# Patient Record
Sex: Female | Born: 2000 | Race: White | Hispanic: No | Marital: Single | Smoking: Never smoker
Health system: Southern US, Community
[De-identification: ages and names within clinical notes are randomized; demographics above are authoritative.]

## PROBLEM LIST (undated history)

## (undated) DIAGNOSIS — F3181 Bipolar II disorder: Secondary | ICD-10-CM

## (undated) DIAGNOSIS — F419 Anxiety disorder, unspecified: Secondary | ICD-10-CM

## (undated) DIAGNOSIS — F329 Major depressive disorder, single episode, unspecified: Secondary | ICD-10-CM

## (undated) DIAGNOSIS — E282 Polycystic ovarian syndrome: Secondary | ICD-10-CM

## (undated) DIAGNOSIS — F32A Depression, unspecified: Secondary | ICD-10-CM

## (undated) DIAGNOSIS — F909 Attention-deficit hyperactivity disorder, unspecified type: Secondary | ICD-10-CM

## (undated) HISTORY — DX: Bipolar II disorder: F31.81

## (undated) HISTORY — DX: Anxiety disorder, unspecified: F41.9

## (undated) HISTORY — DX: Depression, unspecified: F32.A

## (undated) HISTORY — DX: Polycystic ovarian syndrome: E28.2

## (undated) HISTORY — DX: Major depressive disorder, single episode, unspecified: F32.9

## (undated) HISTORY — DX: Attention-deficit hyperactivity disorder, unspecified type: F90.9

## (undated) HISTORY — PX: WISDOM TOOTH EXTRACTION: SHX21

---
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
## 2019-02-27 ENCOUNTER — Ambulatory Visit: Payer: 59 | Admitting: Adult Health

## 2019-04-30 DIAGNOSIS — R05 Cough: Secondary | ICD-10-CM | POA: Diagnosis not present

## 2019-04-30 DIAGNOSIS — J4521 Mild intermittent asthma with (acute) exacerbation: Secondary | ICD-10-CM | POA: Diagnosis not present

## 2019-05-02 ENCOUNTER — Ambulatory Visit: Payer: MEDICAID | Admitting: Family Medicine

## 2019-05-08 ENCOUNTER — Ambulatory Visit: Payer: MEDICAID | Admitting: Family Medicine

## 2019-05-11 DIAGNOSIS — U071 COVID-19: Secondary | ICD-10-CM | POA: Diagnosis not present

## 2019-05-14 ENCOUNTER — Other Ambulatory Visit: Payer: Self-pay

## 2019-05-15 ENCOUNTER — Ambulatory Visit (INDEPENDENT_AMBULATORY_CARE_PROVIDER_SITE_OTHER): Payer: 59 | Admitting: Family Medicine

## 2019-05-15 ENCOUNTER — Other Ambulatory Visit: Payer: Self-pay

## 2019-05-15 ENCOUNTER — Encounter: Payer: Self-pay | Admitting: Family Medicine

## 2019-05-15 VITALS — BP 123/79 | HR 90 | Temp 98.9°F | Ht 64.0 in | Wt 282.0 lb

## 2019-05-15 DIAGNOSIS — F322 Major depressive disorder, single episode, severe without psychotic features: Secondary | ICD-10-CM

## 2019-05-15 DIAGNOSIS — F319 Bipolar disorder, unspecified: Secondary | ICD-10-CM | POA: Diagnosis not present

## 2019-05-15 DIAGNOSIS — R42 Dizziness and giddiness: Secondary | ICD-10-CM | POA: Diagnosis not present

## 2019-05-15 DIAGNOSIS — K921 Melena: Secondary | ICD-10-CM | POA: Diagnosis not present

## 2019-05-15 DIAGNOSIS — Z23 Encounter for immunization: Secondary | ICD-10-CM | POA: Diagnosis not present

## 2019-05-15 DIAGNOSIS — J4520 Mild intermittent asthma, uncomplicated: Secondary | ICD-10-CM

## 2019-05-15 MED ORDER — FLUTICASONE PROPIONATE 50 MCG/ACT NA SUSP: 2.0000 | Freq: Every day | NASAL | 0 refills | Status: DC

## 2019-05-15 NOTE — Patient Instructions (Signed)
Flonase in the morning. Antihistamine (claritin, zyrtec, allegra, xyzal) in the evening.

## 2019-05-15 NOTE — Progress Notes (Signed)
New Patient Office Visit  Assessment & Plan:  1. Dizziness - Encouraged patient to use Flonase every morning and an antihistamine every evening to improve dizziness. - fluticasone (FLONASE) 50 MCG/ACT nasal spray; Place 2 sprays into both nostrils daily.  Dispense: 16 g; Refill: 0  2. Need for immunization against influenza - Flu Vaccine QUAD 36+ mos IM  3. Frank blood in stool - Advised patient to monitor for more blood in her stool as I feel this is likely due to the fact that she was having so many bowel movements in one day that started out as constipation.  4. Mild intermittent asthma without complication - Well controlled on current regimen.   5-6. Bipolar depression (HCC)/Severe major depression (HCC) - Managed by psychiatrist.   Follow-up: Return in about 6 months (around 11/15/2019) for annual physical.   Deliah Boston, MSN, APRN, FNP-C [facility]  Subjective:  Patient ID: Frances Clark, female    DOB: [birth year]  Age: 19 y.o. MRN: [id number]  Patient Care Team: [name], FNP as PCP - General (Family Medicine) Georgiann Cocker, MD as Consulting Physician (Psychiatry)  CC:  Chief Complaint  Patient presents with  . New Patient (Initial Visit)    [employer]   . Establish Care  . Dizziness    COVID + 2/15 and has been dizzy since then  . Blood In Stools    last night once     HPI Frances Clark presents to establish care. She is transferring care from Dr. Lomax Poehler [place] office as he has retired and the office has closed.  Prior to going to Dr. Lysbeth Galas patient was seeing Dr. Loney Hering.  Asthma: doesn't use Albuterol "too often". Hasn't had to use since she moved out of her mother's house a year ago - her mother smoked around her.   Patient has been experiencing dizziness since she had COVID-19 last month (2+ weeks). It lasts all day. It does have times when it is better or worse but does not completely resolve. Meclizine does not  improve it.   Patient reports she had six bowels movements yesterday with the last one having some "blood clots". The last stool was soft and unformed. The first BM was hard. The ones in-between were all "normal and not difficult". Normal bowel movements occur once daily. She normally has 1-2 per day.  States her last bowel movement was the day prior.  She feels "weird in her stomach" with slight nausea. States she doesn't feel it unless she thinks about it.  She did bring a picture of her last bowel movement with her.  There is frank blood in the toilet.  Patient also reports she has been told recently that she grinds her teeth in her sleep.  Depression screen PHQ 2/9 05/17/2019  Decreased Interest 3  Down, Depressed, Hopeless 2  PHQ - 2 Score 5  Altered sleeping 1  Tired, decreased energy 1  Change in appetite 3  Feeling bad or failure about yourself  2  Trouble concentrating 2  Moving slowly or fidgety/restless 1  Suicidal thoughts 1  PHQ-9 Score 16  Difficult doing work/chores Somewhat difficult  *Patient states she has only had thoughts that she would be better off dead but has no plans to hurt herself.  GAD 7 : Generalized Anxiety Score 05/17/2019  Nervous, Anxious, on Edge 2  Control/stop worrying 1  Worry too much - different things 1  Trouble relaxing 2  Restless 1  Easily annoyed or irritable 2  Afraid - awful might happen 1  Total GAD 7 Score 10  Anxiety Difficulty Somewhat difficult     Review of Systems  Constitutional: Negative for chills, fever, malaise/fatigue and weight loss.  HENT: Negative for congestion, ear discharge, ear pain, nosebleeds, sinus pain, sore throat and tinnitus.   Eyes: Negative for blurred vision, double vision, pain, discharge and redness.  Respiratory: Negative for cough, shortness of breath and wheezing.   Cardiovascular: Negative for chest pain, palpitations and leg swelling.  Gastrointestinal: Positive for blood in stool. Negative for  abdominal pain, constipation, diarrhea, heartburn, nausea and vomiting.  Genitourinary: Negative for dysuria, frequency and urgency.  Musculoskeletal: Negative for myalgias.  Skin: Negative for rash.  Neurological: Positive for dizziness. Negative for seizures, weakness and headaches.  Psychiatric/Behavioral: Negative for depression, substance abuse and suicidal ideas. The patient is not nervous/anxious.     Current Outpatient Medications:  .  acetaminophen (TYLENOL) 500 MG tablet, Take 1,000 mg by mouth every 6 (six) hours as needed for mild pain., Disp: , Rfl:  .  albuterol (PROVENTIL) (5 MG/ML) 0.5% nebulizer solution, Inhale 0.5 mLs into the lungs every 4 (four) hours as needed for wheezing., Disp: , Rfl:  .  albuterol (PROVENTIL,VENTOLIN) 90 MCG/ACT inhaler, Inhale 2-4 puffs into the lungs 2 (two) times daily as needed for wheezing or shortness of breath. Wheezing , Disp: , Rfl:  .  amphetamine-dextroamphetamine (ADDERALL XR) 5 MG 24 hr capsule, Take 1 capsule by mouth daily., Disp: , Rfl:  .  DULoxetine (CYMBALTA) 60 MG capsule, Take 1 capsule (60 mg total) by mouth daily. (Patient taking differently: Take 120 mg by mouth daily. ), Disp: 90 capsule, Rfl: 2 .  gabapentin (NEURONTIN) 300 MG capsule, Take 1 capsule by mouth daily as needed., Disp: , Rfl:  .  ibuprofen (ADVIL,MOTRIN) 200 MG tablet, Take 400 mg by mouth daily as needed for headache., Disp: , Rfl:  .  lamoTRIgine (LAMICTAL) 25 MG tablet, Take 1 tablet by mouth daily., Disp: , Rfl:  .  meclizine (ANTIVERT) 25 MG tablet, Take 25 mg by mouth every 8 (eight) hours as needed for dizziness., Disp: , Rfl:  .  fluticasone (FLONASE) 50 MCG/ACT nasal spray, Place 2 sprays into both nostrils daily., Disp: 16 g, Rfl: 0  Allergies  Allergen Reactions  . Other     CATS    Past Medical History:  Diagnosis Date  . Anxiety   . Asthma   . Bipolar 2 disorder (HCC)   . Depression   . Polycystic ovarian syndrome     Past Surgical  History:  Procedure Laterality Date  . CHOLECYSTECTOMY N/A 10/20/2018   Procedure: LAPAROSCOPIC CHOLECYSTECTOMY;  Surgeon: Axel Filler, MD;  Location: [facility] OR;  Service: General;  Laterality: N/A;  . ENDOSCOPIC RETROGRADE CHOLANGIOPANCREATOGRAPHY (ERCP) WITH PROPOFOL N/A 10/21/2018   Procedure: ENDOSCOPIC RETROGRADE CHOLANGIOPANCREATOGRAPHY (ERCP) WITH PROPOFOL;  Surgeon: Vida Rigger, MD;  Location: [facility] ENDOSCOPY;  Service: Endoscopy;  Laterality: N/A;  . REMOVAL OF STONES  10/21/2018   Procedure: REMOVAL OF STONES;  Surgeon: Vida Rigger, MD;  Location: [facility] ENDOSCOPY;  Service: Endoscopy;;  . Dennison Mascot  10/21/2018   Procedure: Dennison Mascot;  Surgeon: Vida Rigger, MD;  Location: [facility] ENDOSCOPY;  Service: Endoscopy;;  . WISDOM TOOTH EXTRACTION      Family History  Problem Relation Age of Onset  . Anxiety disorder Mother   . Drug abuse Mother   . Anxiety disorder Maternal Aunt   . Anxiety disorder Maternal  Grandmother   . Alcohol abuse Maternal Grandmother   . Alcohol abuse Paternal Grandmother   . Depression Paternal Grandmother   . Anxiety disorder Paternal Grandmother   . Drug abuse Maternal Aunt   . Asthma Sister   . Diabetes Maternal Grandfather   . Heart disease Paternal Grandfather     Social History   Socioeconomic History  . Marital status: Single    Spouse name: Not on file  . Number of children: Not on file  . Years of education: Not on file  . Highest education level: Not on file  Occupational History  . Not on file  Tobacco Use  . Smoking status: Never Smoker  . Smokeless tobacco: Never Used  Substance and Sexual Activity  . Alcohol use: No  . Drug use: No  . Sexual activity: Never  Other Topics Concern  . Not on file  Social History Narrative  . Not on file   Social Determinants of Health   Financial Resource Strain:   . Difficulty of Paying Living Expenses: Not on file  Food Insecurity:   . Worried About Charity fundraiser in the Last Year: Not on  file  . Ran Out of Food in the Last Year: Not on file  Transportation Needs:   . Lack of Transportation (Medical): Not on file  . Lack of Transportation (Non-Medical): Not on file  Physical Activity:   . Days of Exercise per Week: Not on file  . Minutes of Exercise per Session: Not on file  Stress:   . Feeling of Stress : Not on file  Social Connections:   . Frequency of Communication with Friends and Family: Not on file  . Frequency of Social Gatherings with Friends and Family: Not on file  . Attends Religious Services: Not on file  . Active Member of Clubs or Organizations: Not on file  . Attends Archivist Meetings: Not on file  . Marital Status: Not on file  Intimate Partner Violence:   . Fear of Current or Ex-Partner: Not on file  . Emotionally Abused: Not on file  . Physically Abused: Not on file  . Sexually Abused: Not on file    Objective:   Today's Vitals: BP 123/79   Pulse 90   Temp 98.9 F (37.2 C) (Temporal)   Ht 5\' 4"  (1.626 m)   Wt 282 lb (127.9 kg)   LMP 05/15/2018   SpO2 96%   BMI 48.41 kg/m   Physical Exam Vitals reviewed.  Constitutional:      General: She is not in acute distress.    Appearance: Normal appearance. She is morbidly obese. She is not ill-appearing, toxic-appearing or diaphoretic.  HENT:     Head: Normocephalic and atraumatic.     Right Ear: Ear canal and external ear normal. A middle ear effusion is present. There is no impacted cerumen.     Left Ear: Tympanic membrane, ear canal and external ear normal. There is no impacted cerumen.  Eyes:     General: No scleral icterus.       Right eye: No discharge.        Left eye: No discharge.     Conjunctiva/sclera: Conjunctivae normal.  Cardiovascular:     Rate and Rhythm: Normal rate and regular rhythm.     Heart sounds: Normal heart sounds. No murmur. No friction rub. No gallop.   Pulmonary:     Effort: Pulmonary effort is normal. No respiratory distress.  Breath sounds:  Normal breath sounds. No stridor. No wheezing, rhonchi or rales.  Musculoskeletal:        General: Normal range of motion.     Cervical back: Normal range of motion.  Skin:    General: Skin is warm and dry.     Capillary Refill: Capillary refill takes less than 2 seconds.  Neurological:     General: No focal deficit present.     Mental Status: She is alert and oriented to person, place, and time. Mental status is at baseline.  Psychiatric:        Mood and Affect: Mood normal.        Behavior: Behavior normal.        Thought Content: Thought content normal.        Judgment: Judgment normal.

## 2019-05-24 ENCOUNTER — Telehealth: Payer: Self-pay | Admitting: Family Medicine

## 2019-05-30 ENCOUNTER — Telehealth: Payer: Self-pay | Admitting: Family Medicine

## 2019-05-30 DIAGNOSIS — F319 Bipolar disorder, unspecified: Secondary | ICD-10-CM

## 2019-05-30 DIAGNOSIS — E282 Polycystic ovarian syndrome: Secondary | ICD-10-CM

## 2019-05-30 NOTE — Telephone Encounter (Signed)
  REFERRAL REQUEST Telephone Note 05/30/2019  What type of referral do you need? Behavioral Health and Gynecology   Have you been seen at our office for this problem? New Patient Appt on 3/21 (Advise that they may need an appointment with their PCP before a referral can be done)  Is there a particular doctor or location that you prefer? [facility] in [place] & [facility] in [place]   Patient notified that referrals can take up to a week or longer to process. If they haven't heard anything within a week they should call back and speak with the referral department.

## 2019-05-31 NOTE — Telephone Encounter (Signed)
LMTRC

## 2019-05-31 NOTE — Telephone Encounter (Signed)
Any idea why we are switching psychiatrists? She has been seeing Dr. Glenda Chroman in [place].   Also, why does she need a GYN?

## 2019-06-01 NOTE — Telephone Encounter (Signed)
She had  diagnosis of PCOS few years back and she would like a local Gyn to care for her.  She can't remember the prior one cause her mom had taken her to appointment.  She wants a therapist that will work with her to learn coping skills and how to handle things better.  She says psychiatrist gives her medication but she wants to go a natural route.

## 2019-06-01 NOTE — Telephone Encounter (Signed)
Left message to please call our office.  Why do you need referral to GYN?  Why do you need top change psychiatrist?

## 2019-06-01 NOTE — Telephone Encounter (Signed)
Patient aware.

## 2019-06-01 NOTE — Telephone Encounter (Signed)
Referrals placed 

## 2019-06-06 ENCOUNTER — Other Ambulatory Visit: Payer: Self-pay | Admitting: Family Medicine

## 2019-06-06 DIAGNOSIS — R42 Dizziness and giddiness: Secondary | ICD-10-CM

## 2019-06-18 ENCOUNTER — Encounter: Payer: Self-pay | Admitting: Women's Health

## 2019-06-18 ENCOUNTER — Other Ambulatory Visit: Payer: Self-pay

## 2019-06-18 ENCOUNTER — Ambulatory Visit (INDEPENDENT_AMBULATORY_CARE_PROVIDER_SITE_OTHER): Payer: 59 | Admitting: Women's Health

## 2019-06-18 VITALS — BP 131/82 | HR 94 | Ht 64.0 in | Wt 264.0 lb

## 2019-06-18 DIAGNOSIS — L0293 Carbuncle, unspecified: Secondary | ICD-10-CM | POA: Diagnosis not present

## 2019-06-18 DIAGNOSIS — Z131 Encounter for screening for diabetes mellitus: Secondary | ICD-10-CM | POA: Diagnosis not present

## 2019-06-18 DIAGNOSIS — N911 Secondary amenorrhea: Secondary | ICD-10-CM | POA: Diagnosis not present

## 2019-06-18 DIAGNOSIS — E282 Polycystic ovarian syndrome: Secondary | ICD-10-CM

## 2019-06-18 DIAGNOSIS — Z1329 Encounter for screening for other suspected endocrine disorder: Secondary | ICD-10-CM

## 2019-06-18 MED ORDER — MEDROXYPROGESTERONE ACETATE 10 MG PO TABS: 10.0000 mg | ORAL_TABLET | Freq: Every day | ORAL | 3 refills | Status: AC

## 2019-06-18 NOTE — Progress Notes (Signed)
   GYN VISIT Patient name: SHEILLA MARIS MRN [id number]  Date of birth: September 04, 2000 Chief Complaint:   PCOS  History of Present Illness:   YASMENE SALOMONE is a 19 y.o. G0P0000 Caucasian female being seen today for report of no period in over a year. Menarche @ 19yo, reg x , then began to skip periods. No period x 28yrs, got on COCs, had regular periods x , then stopped d/t worsening dep/anx. No period since (Dec 2019). +facial hair, acne, thinning of head hair. Recurrent boils mons area, none active now.   No LMP recorded. (Menstrual status: Other). The current method of family planning is abstinence right now.  Last pap <21yo. Results were:  n/a Review of Systems:   Pertinent items are noted in HPI Denies fever/chills, dizziness, headaches, visual disturbances, fatigue, shortness of breath, chest pain, abdominal pain, vomiting, abnormal vaginal discharge/itching/odor/irritation, problems with periods, bowel movements, urination, or intercourse unless otherwise stated above.  Pertinent History Reviewed:  Reviewed past medical,surgical, social, obstetrical and family history.  Reviewed problem list, medications and allergies. Physical Assessment:   Vitals:   06/18/19 1030  BP: 131/82  Pulse: 94  Weight: 264 lb (119.7 kg)  Height: 5\' 4"  (1.626 m)  Body mass index is 45.32 kg/m.       Physical Examination:   General appearance: alert, well appearing, and in no distress  Mental status: alert, oriented to person, place, and time  Skin: warm & dry, +acanthosis nigricans neck, none axillary  Cardiovascular: normal heart rate noted  Respiratory: normal respiratory effort, no distress  Abdomen: soft, non-tender   Pelvic: examination not indicated  Extremities: no edema   Chaperone: n/a    No results found for this or any previous visit (from the past 24 hour(s)).  Assessment & Plan:  1) PCOS w/ secondary amenorrhea> doesn't want COCs d/t worsening dep/anx last time she was  on them. Discussed provera q , wants to do this. Rx provera 10mg  x 10d q62mths, let me know if doesn't bleed after taking. 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 medroxyPROGESTERone (PROVERA) 10 MG tablet    Sig: Take 1 tablet (10 mg total) by mouth daily. X 10days every 3 months    Dispense:  10 tablet    Refill:  3    Order Specific Question:   Supervising Provider    Answer:   1m H [[id number]]    Orders Placed This Encounter  Procedures  . Hemoglobin A1c  . TSH    Return in about 3 months (around 09/17/2019) for F/U, CNM.  Duane Lope CNM, [facility] 06/18/2019 12:36 PM

## 2019-06-18 NOTE — Patient Instructions (Addendum)
Hidradenitis . Loose, light clothing. Avoid heat, friction, shearing. Don't squeeze! . Wash clothes in perfume/dye free detergent . Gentle non-soap cleanser, wash gently w/ fingers (No cloth, loofah), can use antibacterial cleanser . Stop smoking! . Weight loss . Decrease/no dairy  Hidradenitis Suppurativa Hidradenitis suppurativa is a long-term (chronic) skin disease that starts with blocked sweat glands or hair follicles. Bacteria may grow in these blocked openings of your skin. Hidradenitis suppurativa is like a severe form of acne that develops in areas of your body where acne would be unusual. It is most likely to affect the areas of your body where skin rubs against skin and becomes moist. This includes your:  Underarms.  Groin.  Genital areas.  Buttocks.  Upper thighs.  Breasts. Hidradenitis suppurativa may start out with small pimples. The pimples can develop into deep sores that break open (rupture) and drain pus. Over time your skin may thicken and become scarred. Hidradenitis suppurativa cannot be passed from person to person. What are the causes? The exact cause of hidradenitis suppurativa is not known. This condition may be due to:  Female and female hormones. The condition is rare before and after puberty.  An overactive body defense system (immune system). Your immune system may overreact to the blocked hair follicles or sweat glands and cause swelling and pus-filled sores. What increases the risk? You may have a higher risk of hidradenitis suppurativa if you:  Are a woman.  Are between ages 11 and 55.  Have a family history of hidradenitis suppurativa.  Have a personal history of acne.  Are overweight.  Smoke.  Take the drug lithium. What are the signs or symptoms? The first signs of an outbreak are usually painful skin bumps that look like pimples. As the condition progresses:  Skin bumps may get bigger and grow deeper into the skin.  Bumps under the  skin may rupture and drain smelly pus.  Skin may become itchy and infected.  Skin may thicken and scar.  Drainage may continue through tunnels under the skin (fistulas).  Walking and moving your arms can become painful. How is this diagnosed? Your health care provider may diagnose hidradenitis suppurativa based on your medical history and your signs and symptoms. A physical exam will also be done. You may need to see a health care provider who specializes in skin diseases (dermatologist). You may also have tests done to confirm the diagnosis. These can include:  Swabbing a sample of pus or drainage from your skin so it can be sent to the lab and tested for infection.  Blood tests to check for infection. How is this treated? The same treatment will not work for everybody with hidradenitis suppurativa. Your treatment will depend on how severe your symptoms are. You may need to try several treatments to find what works best for you. Part of your treatment may include cleaning and bandaging (dressing) your wounds. You may also have to take medicines, such as the following:  Antibiotics.  Acne medicines.  Medicines to block or suppress the immune system.  A diabetes medicine (metformin) is sometimes used to treat this condition.  For women, birth control pills can sometimes help relieve symptoms. You may need surgery if you have a severe case of hidradenitis suppurativa that does not respond to medicine. Surgery may involve:  Using a laser to clear the skin and remove hair follicles.  Opening and draining deep sores.  Removing the areas of skin that are diseased and scarred. Follow these instructions   at home:  Learn as much as you can about your disease, and work closely with your health care providers.  Take medicines only as directed by your health care provider.  If you were prescribed an antibiotic medicine, finish it all even if you start to feel better.  If you are  overweight, losing weight may be very helpful. Try to reach and maintain a healthy weight.  Do not use any tobacco products, including cigarettes, chewing tobacco, or electronic cigarettes. If you need help quitting, ask your health care provider.  Do not shave the areas where you get hidradenitis suppurativa.  Do not wear deodorant.  Wear loose-fitting clothes.  Try not to overheat and get sweaty.  Take a daily bleach bath as directed by your health care provider.  Fill your bathtub halfway with water.  Pour in  cup of unscented household bleach.  Soak for 5-10 minutes.  Cover sore areas with a warm, clean washcloth (compress) for 5-10 minutes. Contact a health care provider if:  You have a flare-up of hidradenitis suppurativa.  You have chills or a fever.  You are having trouble controlling your symptoms at home. This information is not intended to replace advice given to you by your health care provider. Make sure you discuss any questions you have with your health care provider. Document Released: 10/14/2003 Document Revised: 08/07/2015 Document Reviewed: 06/01/2013 Elsevier Interactive Patient Education  2017 Elsevier Inc.    Polycystic Ovarian Syndrome  Polycystic ovarian syndrome (PCOS) is a common hormonal disorder among women of reproductive age. In most women with PCOS, many small fluid-filled sacs (cysts) grow on the ovaries, and the cysts are not part of a normal menstrual cycle. PCOS can cause problems with your menstrual periods and make it difficult to get pregnant. It can also cause an increased risk of miscarriage with pregnancy. If it is not treated, PCOS can lead to serious health problems, such as diabetes and heart disease. What are the causes? The cause of PCOS is not known, but it may be the result of a combination of certain factors, such as:  Irregular menstrual cycle.  High levels of certain hormones (androgens).  Problems with the hormone that  helps to control blood sugar (insulin resistance).  Certain genes. What increases the risk? This condition is more likely to develop in women who have a family history of PCOS. What are the signs or symptoms? Symptoms of PCOS may include:  Multiple ovarian cysts.  Infrequent periods or no periods.  Periods that are too frequent or too heavy.  Unpredictable periods.  Inability to get pregnant (infertility) because of not ovulating.  Increased growth of hair on the face, chest, stomach, back, thumbs, thighs, or toes.  Acne or oily skin. Acne may develop during adulthood, and it may not respond to treatment.  Pelvic pain.  Weight gain or obesity.  Patches of thickened and dark brown or black skin on the neck, arms, breasts, or thighs (acanthosis nigricans).  Excess hair growth on the face, chest, abdomen, or upper thighs (hirsutism). How is this diagnosed? This condition is diagnosed based on:  Your medical history.  A physical exam, including a pelvic exam. Your health care provider may look for areas of increased hair growth on your skin.  Tests, such as: ? Ultrasound. This may be used to examine the ovaries and the lining of the uterus (endometrium) for cysts. ? Blood tests. These may be used to check levels of sugar (glucose), female hormone (testosterone), and  female hormones (estrogen and progesterone) in your blood. How is this treated? There is no cure for PCOS, but treatment can help to manage symptoms and prevent more health problems from developing. Treatment varies depending on:  Your symptoms.  Whether you want to have a baby or whether you need birth control (contraception). Treatment may include nutrition and lifestyle changes along with:  Progesterone hormone to start a menstrual period.  Birth control pills to help you have regular menstrual periods.  Medicines to make you ovulate, if you want to get pregnant.  Medicine to reduce excessive hair  growth.  Surgery, in severe cases. This may involve making small holes in one or both of your ovaries. This decreases the amount of testosterone that your body produces. Follow these instructions at home:  Take over-the-counter and prescription medicines only as told by your health care provider.  Follow a healthy meal plan. This can help you reduce the effects of PCOS. ? Eat a healthy diet that includes lean proteins, complex carbohydrates, fresh fruits and vegetables, low-fat dairy products, and healthy fats. Make sure to eat enough fiber.  If you are overweight, lose weight as told by your health care provider. ? Losing 10% of your body weight may improve symptoms. ? Your health care provider can determine how much weight loss is best for you and can help you lose weight safely.  Keep all follow-up visits as told by your health care provider. This is important. Contact a health care provider if:  Your symptoms do not get better with medicine.  You develop new symptoms. This information is not intended to replace advice given to you by your health care provider. Make sure you discuss any questions you have with your health care provider. Document Revised: 02/11/2017 Document Reviewed: 08/17/2015 Elsevier Patient Education  2020 Elsevier Inc.   Diet for Polycystic Ovary Syndrome Polycystic ovary syndrome (PCOS) is a disorder of the chemicals (hormones) that regulate a woman's reproductive system, including monthly periods (menstruation). The condition causes important hormones to be out of balance. PCOS can:  Stop your periods or make them irregular.  Cause cysts to develop on your ovaries.  Make it difficult to get pregnant.  Stop your body from responding to the effects of insulin (insulin resistance). Insulin resistance can lead to obesity and diabetes. Changing what you eat can help you manage PCOS and improve your health. Following a balanced diet can help you lose weight  and improve the way that your body uses insulin. What are tips for following this plan?  Follow a balanced diet for meals and snacks. Eat breakfast, lunch, dinner, and one or two snacks every day.  Include protein in each meal and snack.  Choose whole grains instead of products that are made with refined flour.  Eat a variety of foods.  Exercise regularly as told by your health care provider. Aim to do 30 or more minutes of exercise on most days of the week.  If you are overweight or obese: ? Pay attention to how many calories you eat. Cutting down on calories can help you lose weight. ? Work with your health care provider or a diet and nutrition specialist (dietitian) to figure out how many calories you need each day. What foods can I eat?  Fruits Include a variety of colors and types. All fruits are helpful for PCOS. Vegetables Include a variety of colors and types. All vegetables are helpful for PCOS. Grains Whole grains, such as whole wheat. Whole-grain  breads, crackers, cereals, and pasta. Unsweetened oatmeal, bulgur, barley, quinoa, and brown rice. Tortillas made from corn or whole-wheat flour. Meats and other proteins Low-fat (lean) proteins, such as fish, chicken, beans, eggs, and tofu. Dairy Low-fat dairy products, such as skim milk, cheese sticks, and yogurt. Beverages Low-fat or fat-free drinks, such as water, low-fat milk, sugar-free drinks, and small amounts of 100% fruit juice. Seasonings and condiments Ketchup. Mustard. Barbecue sauce. Relish. Low-fat or fat-free mayonnaise. Fats and oils Olive oil or canola oil. Walnuts and almonds. The items listed above may not be a complete list of recommended foods and beverages. Contact a dietitian for more options. What foods are not recommended? Foods that are high in calories or fat. Fried foods. Sweets. Products that are made from refined white flour, including white bread, pastries, white rice, and pasta. The items  listed above may not be a complete list of foods and beverages to avoid. Contact a dietitian for more information. Summary  PCOS is a hormonal imbalance that affects a woman's reproductive system.  You can help to manage your PCOS by exercising regularly and eating a healthy, varied diet of vegetables, fruit, whole grains, low-fat (lean) protein, and low-fat dairy products.  Changing what you eat can improve the way that your body uses insulin, help your hormones reach normal levels, and help you lose weight. This information is not intended to replace advice given to you by your health care provider. Make sure you discuss any questions you have with your health care provider. Document Revised: 06/21/2018 Document Reviewed: 01/03/2017 Elsevier Patient Education  2020 [employer].

## 2019-06-19 LAB — HEMOGLOBIN A1C
Est. average glucose Bld gHb Est-mCnc: 114 mg/dL
Hgb A1c MFr Bld: 5.6 % (ref 4.8–5.6)

## 2019-06-19 LAB — TSH: TSH: 3.76 u[IU]/mL (ref 0.450–4.500)

## 2019-07-11 ENCOUNTER — Other Ambulatory Visit: Payer: Self-pay

## 2019-07-11 ENCOUNTER — Encounter: Payer: Self-pay | Admitting: Family Medicine

## 2019-07-11 ENCOUNTER — Ambulatory Visit (INDEPENDENT_AMBULATORY_CARE_PROVIDER_SITE_OTHER): Payer: 59 | Admitting: Family Medicine

## 2019-07-11 VITALS — BP 126/81 | HR 94 | Temp 97.8°F | Ht 64.0 in | Wt 289.0 lb

## 2019-07-11 DIAGNOSIS — G4719 Other hypersomnia: Secondary | ICD-10-CM

## 2019-07-11 DIAGNOSIS — R0683 Snoring: Secondary | ICD-10-CM | POA: Diagnosis not present

## 2019-07-11 MED ORDER — ALBUTEROL SULFATE HFA 108 (90 BASE) MCG/ACT IN AERS: 2.0000 | INHALATION_SPRAY | Freq: Four times a day (QID) | RESPIRATORY_TRACT | 5 refills | Status: AC | PRN

## 2019-07-11 NOTE — Patient Instructions (Signed)

## 2019-07-11 NOTE — Progress Notes (Signed)
Assessment & Plan:  1. Snoring - Ambulatory referral to Pulmonology  2. Excessive daytime sleepiness - Ambulatory referral to Pulmonology   Return if symptoms worsen or fail to improve.  Deliah Boston, MSN, APRN, FNP-C [facility]  Subjective:    Patient ID: Frances Clark, adult    DOB: 30-Oct-2000, 19 y.o.   MRN: [id number]  Patient Care Team: [name], FNP as PCP - General (Family Medicine) Georgiann Cocker, MD as Consulting Physician (Psychiatry)   Chief Complaint:  Chief Complaint  Patient presents with  . Referral    Sleep study for sleep apnea due to snoring.    HPI: Frances Clark is a 19 y.o. adult presenting on 07/11/2019 for Referral (Sleep study for sleep apnea due to snoring.)  Patient is here for a concern of possible sleep apnea after being told that she snores.   STOP-BANG: Height: 5\' 4"  (1.626 m)  Weight: 289 lbs BMI: Body mass index is 49.61 kg/m.  Neck circumference: 52 cm  1. Do you snore loudly (louder than talking or loud enough to be heard through closed doors)?  Yes 2. Do you often feel tired, fatigued, or sleepy during daytime?  Yes 3. Has anyone observed you stop breathing during your sleep?  No 4. Do you have or are you being treated for high blood pressure?  No 5. BMI more than 35 kg/m2?  Yes 6. Age over 50 years?  No 7. Neck circumference greater than 40 cm?  Yes 8. Female gender?  No  Total "Yes" Answers: 4  New complaints: None  Social history:  Relevant past medical, surgical, family and social history reviewed and updated as indicated. Interim medical history since our last visit reviewed.  Allergies and medications reviewed and updated.  DATA REVIEWED: CHART IN EPIC  ROS: Negative unless specifically indicated above in HPI.    Current Outpatient Medications:  .  acetaminophen (TYLENOL) 500 MG tablet, Take 1,000 mg by mouth every 6 (six) hours as needed for mild pain., Disp: ,  Rfl:  .  albuterol (PROVENTIL) (5 MG/ML) 0.5% nebulizer solution, Inhale 0.5 mLs into the lungs every 4 (four) hours as needed for wheezing., Disp: , Rfl:  .  albuterol (PROVENTIL,VENTOLIN) 90 MCG/ACT inhaler, Inhale 2-4 puffs into the lungs 2 (two) times daily as needed for wheezing or shortness of breath. Wheezing , Disp: , Rfl:  .  amphetamine-dextroamphetamine (ADDERALL XR) 5 MG 24 hr capsule, Take 1 capsule by mouth daily., Disp: , Rfl:  .  clonazePAM (KLONOPIN) 0.5 MG tablet, Take 0.5 mg by mouth daily as needed., Disp: , Rfl:  .  DULoxetine (CYMBALTA) 60 MG capsule, Take 1 capsule (60 mg total) by mouth daily. (Patient taking differently: Take 120 mg by mouth daily. ), Disp: 90 capsule, Rfl: 2 .  fluticasone (FLONASE) 50 MCG/ACT nasal spray, SPRAY 2 SPRAYS INTO EACH NOSTRIL EVERY DAY, Disp: 16 mL, Rfl: 5 .  ibuprofen (ADVIL,MOTRIN) 200 MG tablet, Take 400 mg by mouth daily as needed for headache., Disp: , Rfl:  .  lamoTRIgine (LAMICTAL) 25 MG tablet, Take 1 tablet by mouth daily., Disp: , Rfl:  .  medroxyPROGESTERone (PROVERA) 10 MG tablet, Take 1 tablet (10 mg total) by mouth daily. X 10days every 3 months, Disp: 10 tablet, Rfl: 3 .  albuterol (VENTOLIN HFA) 108 (90 Base) MCG/ACT inhaler, Inhale 2 puffs into the lungs every 6 (six) hours as needed for wheezing or shortness of breath., Disp: 18 g, Rfl:  5   Allergies  Allergen Reactions  . Other     CATS   Past Medical History:  Diagnosis Date  . Anxiety   . Asthma   . Bipolar 2 disorder (HCC)   . Depression   . Polycystic ovarian syndrome     Past Surgical History:  Procedure Laterality Date  . CHOLECYSTECTOMY N/A 10/20/2018   Procedure: LAPAROSCOPIC CHOLECYSTECTOMY;  Surgeon: Axel Filler, MD;  Location: [facility] OR;  Service: General;  Laterality: N/A;  . ENDOSCOPIC RETROGRADE CHOLANGIOPANCREATOGRAPHY (ERCP) WITH PROPOFOL N/A 10/21/2018   Procedure: ENDOSCOPIC RETROGRADE CHOLANGIOPANCREATOGRAPHY (ERCP) WITH PROPOFOL;  Surgeon: Vida Rigger, MD;  Location: [facility] ENDOSCOPY;  Service: Endoscopy;  Laterality: N/A;  . REMOVAL OF STONES  10/21/2018   Procedure: REMOVAL OF STONES;  Surgeon: Vida Rigger, MD;  Location: [facility] ENDOSCOPY;  Service: Endoscopy;;  . Dennison Mascot  10/21/2018   Procedure: Dennison Mascot;  Surgeon: Vida Rigger, MD;  Location: [facility] ENDOSCOPY;  Service: Endoscopy;;  . WISDOM TOOTH EXTRACTION      Social History   Socioeconomic History  . Marital status: Single    Spouse name: Not on file  . Number of children: Not on file  . Years of education: Not on file  . Highest education level: Not on file  Occupational History  . Not on file  Tobacco Use  . Smoking status: Never Smoker  . Smokeless tobacco: Never Used  Substance and Sexual Activity  . Alcohol use: No  . Drug use: No  . Sexual activity: Never    Birth control/protection: None  Other Topics Concern  . Not on file  Social History Narrative  . Not on file   Social Determinants of Health   Financial Resource Strain: Low Risk   . Difficulty of Paying Living Expenses: Not very hard  Food Insecurity: No Food Insecurity  . Worried About Programme researcher, broadcasting/film/video in the Last Year: Never true  . Ran Out of Food in the Last Year: Never true  Transportation Needs: No Transportation Needs  . Lack of Transportation (Medical): No  . Lack of Transportation (Non-Medical): No  Physical Activity: Insufficiently Active  . Days of Exercise per Week: 1 day  . Minutes of Exercise per Session: 30 min  Stress: Stress Concern Present  . Feeling of Stress : Rather much  Social Connections: Severely Isolated  . Frequency of Communication with Friends and Family: Once a week  . Frequency of Social Gatherings with Friends and Family: Once a week  . Attends Religious Services: Never  . Active Member of Clubs or Organizations: No  . Attends Banker Meetings: Never  . Marital Status: Never married  Intimate Partner Violence: Not At Risk  . Fear of Current or  Ex-Partner: No  . Emotionally Abused: No  . Physically Abused: No  . Sexually Abused: No        Objective:    BP 126/81   Pulse 94   Temp 97.8 F (36.6 C) (Temporal)   Ht 5\' 4"  (1.626 m)   Wt 289 lb (131.1 kg)   LMP 07/05/2019 (Approximate)   SpO2 92%   BMI 49.61 kg/m   Wt Readings from Last 3 Encounters:  07/11/19 289 lb (131.1 kg) (>99 %, Z= 2.78)*  06/18/19 264 lb (119.7 kg) (>99 %, Z= 2.61)*  05/15/19 282 lb (127.9 kg) (>99 %, Z= 2.73)*   * Growth percentiles are based on CDC (Girls, 2-20 Years) data.    Physical Exam Vitals reviewed.  Constitutional:  General: Frances Clark is not in acute distress.    Appearance: Normal appearance. Frances Clark is morbidly obese. Frances Clark is not ill-appearing, toxic-appearing or diaphoretic.  HENT:     Head: Normocephalic and atraumatic.  Eyes:     General: No scleral icterus.       Right eye: No discharge.        Left eye: No discharge.     Conjunctiva/sclera: Conjunctivae normal.  Cardiovascular:     Rate and Rhythm: Normal rate and regular rhythm.     Heart sounds: Normal heart sounds. No murmur. No friction rub. No gallop.   Pulmonary:     Effort: Pulmonary effort is normal. No respiratory distress.     Breath sounds: Normal breath sounds. No stridor. No wheezing, rhonchi or rales.  Musculoskeletal:        General: Normal range of motion.     Cervical back: Normal range of motion.  Skin:    General: Skin is warm and dry.  Neurological:     Mental Status: Frances Clark is alert and oriented to person, place, and time. Mental status is at baseline.  Psychiatric:        Mood and Affect: Mood normal.        Behavior: Behavior normal.        Thought Content: Thought content normal.        Judgment: Judgment normal.     Lab Results  Component Value Date   TSH 3.760 06/18/2019   Lab Results  Component Value Date   WBC 9.0 10/22/2018   HGB 10.7 (L) 10/22/2018   HCT 34.6 (L) 10/22/2018   MCV  88.3 10/22/2018   PLT 330 10/22/2018   Lab Results  Component Value Date   NA 140 10/22/2018   K 3.7 10/22/2018   CO2 23 10/22/2018   GLUCOSE 114 (H) 10/22/2018   BUN 6 10/22/2018   CREATININE 0.69 10/22/2018   BILITOT 0.6 10/22/2018   ALKPHOS 119 10/22/2018   AST 343 (H) 10/22/2018   ALT 611 (H) 10/22/2018   PROT 6.0 (L) 10/22/2018   ALBUMIN 3.2 (L) 10/22/2018   CALCIUM 8.7 (L) 10/22/2018   ANIONGAP 11 10/22/2018   Lab Results  Component Value Date   CHOL 135 03/11/2016   Lab Results  Component Value Date   HDL 47 03/11/2016   Lab Results  Component Value Date   LDLCALC 71 03/11/2016   Lab Results  Component Value Date   TRIG 85 03/11/2016   Lab Results  Component Value Date   CHOLHDL 2.9 03/11/2016   Lab Results  Component Value Date   HGBA1C 5.6 06/18/2019

## 2019-07-12 ENCOUNTER — Telehealth: Payer: Self-pay | Admitting: Family Medicine

## 2019-07-12 ENCOUNTER — Telehealth: Payer: Self-pay | Admitting: Women's Health

## 2019-07-12 NOTE — Telephone Encounter (Signed)
Seen Frances Clark a couple of weeks ago and was put on meds to help her start cycle. It did but was really spotty the first few days. Now is to the extreme having to change tampon every 2 hours. Just wants to make sure that is okay pt is concerned.

## 2019-07-13 NOTE — Telephone Encounter (Signed)
Patient states she is now bleeding and changing her tampon every 2 hours.  Wants to know if that is too much.  Informed patient this is about average but if she was saturating a tampon or pad every 30 minutes, to let us know. Pt verbalized understanding.

## 2019-07-16 ENCOUNTER — Telehealth: Payer: Self-pay | Admitting: Family Medicine

## 2019-07-20 ENCOUNTER — Telehealth: Payer: Self-pay | Admitting: Family Medicine

## 2019-07-20 NOTE — Telephone Encounter (Signed)
prilosec

## 2019-07-20 NOTE — Telephone Encounter (Signed)
rc for referral

## 2019-07-20 NOTE — Telephone Encounter (Signed)
Left voice mail advising to purchase priolosec, over the counter at possibly [facility].

## 2019-07-22 ENCOUNTER — Encounter: Payer: Self-pay | Admitting: Family Medicine

## 2019-07-23 NOTE — Progress Notes (Signed)
Assessment & Plan:  1. Back pain with sciatica - Education provided on exercises to complete daily. Ibuprofen/Tylenol PRN.  - predniSONE (STERAPRED UNI-PAK 21 TAB) 10 MG (21) TBPK tablet; As directed x 6 days  Dispense: 21 tablet; Refill: 0  2. Seborrheic dermatitis of scalp - mometasone (ELOCON) 0.1 % cream; Apply 1 application topically daily.  Dispense: 45 g; Refill: 0   Follow up plan: Return if symptoms worsen or fail to improve.  Hendricks Limes, MSN, APRN, FNP-C [facility]  Subjective:   Patient ID: Frances Clark, adult    DOB: 10/09/2000, 19 y.o.   MRN: [id number]  HPI: Frances Clark is a 19 y.o. adult presenting on 07/24/2019 for nerve pain (Patient states depending on how she moves will depend where the shooting nerve pain is.  x 2-3 weeks) and upper back pain (x 2-3 weeks)  If she sits wrong in a chair the pain shoots down bilateral legs. She is experiencing sharp pain in her back as well. These pains have been present daily for the past 2-3 weeks; some days are worse than others. No known injury or alternation in usual activities.   Patient has a red spot on her scalp at her forehead she would like something for. She reports really bad dandruff that she is able to control well, except this one area.    ROS: Negative unless specifically indicated above in HPI.   Relevant past medical history reviewed and updated as indicated.   Allergies and medications reviewed and updated.   Current Outpatient Medications:  .  acetaminophen (TYLENOL) 500 MG tablet, Take 1,000 mg by mouth every 6 (six) hours as needed for mild pain., Disp: , Rfl:  .  albuterol (PROVENTIL) (5 MG/ML) 0.5% nebulizer solution, Inhale 0.5 mLs into the lungs every 4 (four) hours as needed for wheezing., Disp: , Rfl:  .  albuterol (PROVENTIL,VENTOLIN) 90 MCG/ACT inhaler, Inhale 2-4 puffs into the lungs 2 (two) times daily as needed for wheezing or shortness of breath. Wheezing ,  Disp: , Rfl:  .  albuterol (VENTOLIN HFA) 108 (90 Base) MCG/ACT inhaler, Inhale 2 puffs into the lungs every 6 (six) hours as needed for wheezing or shortness of breath., Disp: 18 g, Rfl: 5 .  amphetamine-dextroamphetamine (ADDERALL XR) 5 MG 24 hr capsule, Take 1 capsule by mouth daily., Disp: , Rfl:  .  clonazePAM (KLONOPIN) 0.5 MG tablet, Take 0.5 mg by mouth daily as needed., Disp: , Rfl:  .  DULoxetine (CYMBALTA) 60 MG capsule, Take 1 capsule (60 mg total) by mouth daily. (Patient taking differently: Take 120 mg by mouth daily. ), Disp: 90 capsule, Rfl: 2 .  fluticasone (FLONASE) 50 MCG/ACT nasal spray, SPRAY 2 SPRAYS INTO EACH NOSTRIL EVERY DAY, Disp: 16 mL, Rfl: 5 .  ibuprofen (ADVIL,MOTRIN) 200 MG tablet, Take 400 mg by mouth daily as needed for headache., Disp: , Rfl:  .  lamoTRIgine (LAMICTAL) 25 MG tablet, Take 1 tablet by mouth daily., Disp: , Rfl:  .  medroxyPROGESTERone (PROVERA) 10 MG tablet, Take 1 tablet (10 mg total) by mouth daily. X 10days every 3 months, Disp: 10 tablet, Rfl: 3 .  mometasone (ELOCON) 0.1 % cream, Apply 1 application topically daily., Disp: 45 g, Rfl: 0 .  predniSONE (STERAPRED UNI-PAK 21 TAB) 10 MG (21) TBPK tablet, As directed x 6 days, Disp: 21 tablet, Rfl: 0  Allergies  Allergen Reactions  . Other     CATS    Objective:  BP 139/90   Pulse (!) 105   Temp 97.7 F (36.5 C) (Temporal)   Ht 5\' 4"  (1.626 m)   Wt 292 lb (132.5 kg)   LMP 07/05/2019 (Approximate)   SpO2 98%   BMI 50.12 kg/m    Physical Exam Vitals reviewed.  Constitutional:      General: Frances Clark is not in acute distress.    Appearance: Normal appearance. Frances Clark is morbidly obese. Frances Clark is not ill-appearing, toxic-appearing or diaphoretic.  HENT:     Head: Normocephalic and atraumatic.  Eyes:     General: No scleral icterus.       Right eye: No discharge.        Left eye: No discharge.     Conjunctiva/sclera: Conjunctivae normal.  Cardiovascular:       Rate and Rhythm: Normal rate.  Pulmonary:     Effort: Pulmonary effort is normal. No respiratory distress.  Musculoskeletal:        General: Normal range of motion.     Cervical back: Normal, normal range of motion and neck supple.     Thoracic back: Normal.     Lumbar back: Normal.  Skin:    General: Skin is warm and dry.     Capillary Refill: Capillary refill takes less than 2 seconds.     Comments: Flaky area with erythema on right side of scalp at forehead.  Neurological:     General: No focal deficit present.     Mental Status: Frances Clark is alert and oriented to person, place, and time. Mental status is at baseline.  Psychiatric:        Mood and Affect: Mood normal.        Behavior: Behavior normal.        Thought Content: Thought content normal.        Judgment: Judgment normal.

## 2019-07-24 ENCOUNTER — Other Ambulatory Visit: Payer: Self-pay

## 2019-07-24 ENCOUNTER — Ambulatory Visit (INDEPENDENT_AMBULATORY_CARE_PROVIDER_SITE_OTHER): Payer: 59 | Admitting: Family Medicine

## 2019-07-24 ENCOUNTER — Encounter: Payer: Self-pay | Admitting: Family Medicine

## 2019-07-24 VITALS — BP 139/90 | HR 105 | Temp 97.7°F | Ht 64.0 in | Wt 292.0 lb

## 2019-07-24 DIAGNOSIS — M5430 Sciatica, unspecified side: Secondary | ICD-10-CM | POA: Diagnosis not present

## 2019-07-24 DIAGNOSIS — L219 Seborrheic dermatitis, unspecified: Secondary | ICD-10-CM

## 2019-07-24 DIAGNOSIS — M549 Dorsalgia, unspecified: Secondary | ICD-10-CM | POA: Diagnosis not present

## 2019-07-24 MED ORDER — MOMETASONE FUROATE 0.1 % EX CREA: 1.0000 "application " | TOPICAL_CREAM | Freq: Every day | CUTANEOUS | 0 refills | Status: DC

## 2019-07-24 MED ORDER — PREDNISONE 10 MG (21) PO TBPK: ORAL_TABLET | ORAL | 0 refills | Status: DC

## 2019-07-24 NOTE — Patient Instructions (Signed)

## 2019-07-24 NOTE — Telephone Encounter (Signed)
Called Patient - went straight to VM. LM regarding Ref.

## 2019-07-26 ENCOUNTER — Institutional Professional Consult (permissible substitution): Payer: 59 | Admitting: Internal Medicine

## 2019-08-22 ENCOUNTER — Ambulatory Visit (INDEPENDENT_AMBULATORY_CARE_PROVIDER_SITE_OTHER): Payer: 59 | Admitting: Physician Assistant

## 2019-08-22 ENCOUNTER — Encounter: Payer: Self-pay | Admitting: Physician Assistant

## 2019-08-22 DIAGNOSIS — L732 Hidradenitis suppurativa: Secondary | ICD-10-CM | POA: Diagnosis not present

## 2019-08-22 MED ORDER — CEPHALEXIN 500 MG PO CAPS: 500.0000 mg | ORAL_CAPSULE | Freq: Two times a day (BID) | ORAL | 0 refills | Status: DC

## 2019-08-22 NOTE — Progress Notes (Signed)
   Virtual Visit via telephone Note Due to COVID-19 pandemic this visit was conducted virtually. This visit type was conducted due to national recommendations for restrictions regarding the COVID-19 Pandemic (e.g. social distancing, sheltering in place) in an effort to limit this patient's exposure and mitigate transmission in our community. All issues noted in this document were discussed and addressed.  A physical exam was not performed with this format.  I connected with Frances Clark on 08/22/19 at 0920 by telephone and verified that I am speaking with the correct person using two identifiers. Frances Clark is currently located at home and is currently with no one during visit. The provider, Caren Macadam, PA-C is located in their office at time of visit.  I discussed the limitations, risks, security and privacy concerns of performing an evaluation and management service by telephone and the availability of in person appointments. I also discussed with the patient that there may be a patient responsible charge related to this service. The patient expressed understanding and agreed to proceed.   History and Present Illness:  HPI Pt with a bump to the R groin are for 2-3 days + redness , swelling, and sig pain to touch No drainage at this time Pt with hx of same in the past These will usually increase in size and drain and then resolve + hx of same in sister   Review of Systems  Constitutional: Negative for chills and fever.     Observations/Objective: defer  Assessment and Plan: Hidradenitis  Follow Up Instructions: Avoid manipulation Keflex rx Warm compresses Nl course reviewed If sx worsen pt to have in person appt for probable I&D    I discussed the assessment and treatment plan with the patient. The patient was provided an opportunity to ask questions and all were answered. The patient agreed with the plan and demonstrated an understanding of the instructions.   The patient was advised to call back or seek an in-person evaluation if the symptoms worsen or if the condition fails to improve as anticipated.  The above assessment and management plan was discussed with the patient. The patient verbalized understanding of and has agreed to the management plan. Patient is aware to call the clinic if symptoms persist or worsen. Patient is aware when to return to the clinic for a follow-up visit. Patient educated on when it is appropriate to go to the emergency department.   Time call ended:    I provided 10 minutes of non-face-to-face time during this encounter.    Caren Macadam, PA-C

## 2019-08-22 NOTE — Patient Instructions (Signed)
Hidradenitis Suppurativa Hidradenitis suppurativa is a long-term (chronic) skin disease. It is similar to a severe form of acne, but it affects areas of the body where acne would be unusual, especially areas of the body where skin rubs against skin and becomes moist. These include:  Underarms.  Groin.  Genital area.  Buttocks.  Upper thighs.  Breasts. Hidradenitis suppurativa may start out as small lumps or pimples caused by blocked sweat glands or hair follicles. Pimples may develop into deep sores that break open (rupture) and drain pus. Over time, affected areas of skin may thicken and become scarred. This condition is rare and does not spread from person to person (non-contagious). What are the causes? The exact cause of this condition is not known. It may be related to:  Female and female hormones.  An overactive disease-fighting system (immune system). The immune system may over-react to blocked hair follicles or sweat glands and cause swelling and pus-filled sores. What increases the risk? You are more likely to develop this condition if you:  Are female.  Are 11-55 years old.  Have a family history of hidradenitis suppurativa.  Have a personal history of acne.  Are overweight.  Smoke.  Take the medicine lithium. What are the signs or symptoms? The first symptoms are usually painful bumps in the skin, similar to pimples. The condition may get worse over time (progress), or it may only cause mild symptoms. If the disease progresses, symptoms may include:  Skin bumps getting bigger and growing deeper into the skin.  Bumps rupturing and draining pus.  Itchy, infected skin.  Skin getting thicker and scarred.  Tunnels under the skin (fistulas) where pus drains from a bump.  Pain during daily activities, such as pain during walking if your groin area is affected.  Emotional problems, such as stress or depression. This condition may affect your appearance and your  ability or willingness to wear certain clothes or do certain activities. How is this diagnosed? This condition is diagnosed by a health care provider who specializes in skin diseases (dermatologist). You may be diagnosed based on:  Your symptoms and medical history.  A physical exam.  Testing a pus sample for infection.  Blood tests. How is this treated? Your treatment will depend on how severe your symptoms are. The same treatment will not work for everybody with this condition. You may need to try several treatments to find what works best for you. Treatment may include:  Cleaning and bandaging (dressing) your wounds as needed.  Lifestyle changes, such as new skin care routines.  Taking medicines, such as: ? Antibiotics. ? Acne medicines. ? Medicines to reduce the activity of the immune system. ? A diabetes medicine (metformin). ? Birth control pills, for women. ? Steroids to reduce swelling and pain.  Working with a mental health care provider, if you experience emotional distress due to this condition. If you have severe symptoms that do not get better with medicine, you may need surgery. Surgery may involve:  Using a laser to clear the skin and remove hair follicles.  Opening and draining deep sores.  Removing the areas of skin that are diseased and scarred. Follow these instructions at home: Medicines   Take over-the-counter and prescription medicines only as told by your health care provider.  If you were prescribed an antibiotic medicine, take it as told by your health care provider. Do not stop taking the antibiotic even if your condition improves. Skin care  If you have open wounds, cover   them with a clean dressing as told by your health care provider. Keep wounds clean by washing them gently with soap and water when you bathe.  Do not shave the areas where you get hidradenitis suppurativa.  Do not wear deodorant.  Wear loose-fitting clothes.  Try to avoid  getting overheated or sweaty. If you get sweaty or wet, change into clean, dry clothes as soon as you can.  To help relieve pain and itchiness, cover sore areas with a warm, clean washcloth (warm compress) for 5-10 minutes as often as needed.  If told by your health care provider, take a bleach bath twice a week: ? Fill your bathtub halfway with water. ? Pour in  cup of unscented household bleach. ? Soak in the tub for 5-10 minutes. ? Only soak from the neck down. Avoid water on your face and hair. ? Shower to rinse off the bleach from your skin. General instructions  Learn as much as you can about your disease so that you have an active role in your treatment. Work closely with your health care provider to find treatments that work for you.  If you are overweight, work with your health care provider to lose weight as recommended.  Do not use any products that contain nicotine or tobacco, such as cigarettes and e-cigarettes. If you need help quitting, ask your health care provider.  If you struggle with living with this condition, talk with your health care provider or work with a mental health care provider as recommended.  Keep all follow-up visits as told by your health care provider. This is important. Where to find more information  Hidradenitis Suppurativa Foundation, Inc.: https://www.hs-foundation.org/ Contact a health care provider if you have:  A flare-up of hidradenitis suppurativa.  A fever or chills.  Trouble controlling your symptoms at home.  Trouble doing your daily activities because of your symptoms.  Trouble dealing with emotional problems related to your condition. Summary  Hidradenitis suppurativa is a long-term (chronic) skin disease. It is similar to a severe form of acne, but it affects areas of the body where acne would be unusual.  The first symptoms are usually painful bumps in the skin, similar to pimples. The condition may get worse over time  (progress), or it may only cause mild symptoms.  If you have open wounds, cover them with a clean dressing as told by your health care provider. Keep wounds clean by washing them gently with soap and water when you bathe.  Besides skin care, treatment may include medicines, laser treatment, and surgery. This information is not intended to replace advice given to you by your health care provider. Make sure you discuss any questions you have with your health care provider. Document Revised: 03/09/2017 Document Reviewed: 03/09/2017 Elsevier Patient Education  2020 Elsevier Inc.  

## 2019-08-28 DIAGNOSIS — M898X1 Other specified disorders of bone, shoulder: Secondary | ICD-10-CM | POA: Diagnosis not present

## 2019-08-28 DIAGNOSIS — J039 Acute tonsillitis, unspecified: Secondary | ICD-10-CM | POA: Diagnosis not present

## 2019-08-28 DIAGNOSIS — R0789 Other chest pain: Secondary | ICD-10-CM | POA: Diagnosis not present

## 2019-09-03 ENCOUNTER — Other Ambulatory Visit: Payer: Self-pay

## 2019-09-03 ENCOUNTER — Encounter: Payer: Self-pay | Admitting: Pulmonary Disease

## 2019-09-03 ENCOUNTER — Ambulatory Visit (INDEPENDENT_AMBULATORY_CARE_PROVIDER_SITE_OTHER): Payer: 59 | Admitting: Pulmonary Disease

## 2019-09-03 ENCOUNTER — Institutional Professional Consult (permissible substitution): Payer: 59 | Admitting: Pulmonary Disease

## 2019-09-03 DIAGNOSIS — G4710 Hypersomnia, unspecified: Secondary | ICD-10-CM

## 2019-09-03 DIAGNOSIS — J4520 Mild intermittent asthma, uncomplicated: Secondary | ICD-10-CM | POA: Diagnosis not present

## 2019-09-03 NOTE — Patient Instructions (Signed)
Home sleep study. Based on this, we will decide about CPAP therapy 

## 2019-09-03 NOTE — Progress Notes (Signed)
Subjective:    Patient ID: Frances Clark, adult    DOB: Apr 11, 2000, 19 y.o.   MRN: [id number]  HPI  19 year old teenager presents for evaluation of excessive daytime somnolence and tiredness. She is undergoing psychiatric evaluation for anxiety, depression and ADHD.  Her medications include Lamictal for mood disorder, Adderall, Cymbalta and clonazepam which she only takes once or twice a week  She reports nonrefreshing sleep in spite of adequate sleep quantity.  Loud snoring has been noted by family members.  She also reports teeth grinding and clenching. She has always been a night owl and reports excessive somnolence for many years .  However this did not interfere with school.  She now works as a Financial trader for [employer] a job that she has had for 3 months and has a 40-minute drive to work. Epworth sleepiness score is 18 She reports sleepiness while sitting and reading, as a passenger in a car or lying down to rest in the afternoons or sitting quietly. She works from 4:30 PM to 1 AM, so bedtime is around 3 AM, sleep latency can be about an hour, she sleeps on her side with 1-2 pillows, reports 1-2 nocturnal awakenings.  She reports being "hard sleeper" and is out of bed around noon still feeling tired with dryness of mouth and occasional headache.  She will sometimes take naps at her mom's house but these are not refreshing. She has gained 40 pounds in the last 2 years for unclear reasons TSH is noted to be normal 06/2019 HbA1c is 5.6, she carries a diagnosis of polycystic ovarian syndrome  There is no history suggestive of cataplexy, sleep paralysis or parasomnias    Past Medical History:  Diagnosis Date  . Anxiety   . Asthma   . Bipolar 2 disorder ([place])   . Depression   . Polycystic ovarian syndrome    Past Surgical History:  Procedure Laterality Date  . CHOLECYSTECTOMY N/A 10/20/2018   Procedure: LAPAROSCOPIC CHOLECYSTECTOMY;  Surgeon: Ralene Ok, MD;  Location: [facility];   Service: General;  Laterality: N/A;  . ENDOSCOPIC RETROGRADE CHOLANGIOPANCREATOGRAPHY (ERCP) WITH PROPOFOL N/A 10/21/2018   Procedure: ENDOSCOPIC RETROGRADE CHOLANGIOPANCREATOGRAPHY (ERCP) WITH PROPOFOL;  Surgeon: Clarene Essex, MD;  Location: [facility];  Service: Endoscopy;  Laterality: N/A;  . REMOVAL OF STONES  10/21/2018   Procedure: REMOVAL OF STONES;  Surgeon: Clarene Essex, MD;  Location: [facility] ENDOSCOPY;  Service: Endoscopy;;  . Joan Mayans  10/21/2018   Procedure: Joan Mayans;  Surgeon: Clarene Essex, MD;  Location: [facility];  Service: Endoscopy;;  . WISDOM TOOTH EXTRACTION      Allergies  Allergen Reactions  . Other     CATS    Social History   Socioeconomic History  . Marital status: Single    Spouse name: Not on file  . Number of children: Not on file  . Years of education: Not on file  . Highest education level: Not on file  Occupational History  . Not on file  Tobacco Use  . Smoking status: Passive Smoke Exposure - Never Smoker  . Smokeless tobacco: Never Used  Vaping Use  . Vaping Use: Never used  Substance and Sexual Activity  . Alcohol use: Yes    Comment: twice a month  . Drug use: No  . Sexual activity: Never    Birth control/protection: None  Other Topics Concern  . Not on file  Social History Narrative  . Not on file   Social Determinants of Health   Financial  Resource Strain: Low Risk   . Difficulty of Paying Living Expenses: Not very hard  Food Insecurity: No Food Insecurity  . Worried About Programme researcher, broadcasting/film/video in the Last Year: Never true  . Ran Out of Food in the Last Year: Never true  Transportation Needs: No Transportation Needs  . Lack of Transportation (Medical): No  . Lack of Transportation (Non-Medical): No  Physical Activity: Insufficiently Active  . Days of Exercise per Week: 1 day  . Minutes of Exercise per Session: 30 min  Stress: Stress Concern Present  . Feeling of Stress : Rather much  Social Connections: Socially Isolated   . Frequency of Communication with Friends and Family: Once a week  . Frequency of Social Gatherings with Friends and Family: Once a week  . Attends Religious Services: Never  . Active Member of Clubs or Organizations: No  . Attends Banker Meetings: Never  . Marital Status: Never married  Intimate Partner Violence: Not At Risk  . Fear of Current or Ex-Partner: No  . Emotionally Abused: No  . Physically Abused: No  . Sexually Abused: No     Family History  Problem Relation Age of Onset  . Anxiety disorder Mother   . Drug abuse Mother   . Anxiety disorder Maternal Aunt   . Anxiety disorder Maternal Grandmother   . Alcohol abuse Maternal Grandmother   . Alcohol abuse Paternal Grandmother   . Depression Paternal Grandmother   . Anxiety disorder Paternal Grandmother   . Drug abuse Maternal Aunt   . Asthma Sister   . Diabetes Maternal Grandfather   . Heart disease Paternal Grandfather      Review of Systems Constitutional: negative for anorexia, fevers and sweats + wt gain Eyes: negative for irritation, redness and visual disturbance  Ears, nose, mouth, throat, and face: negative for earaches, epistaxis, nasal congestion and sore throat  Respiratory: negative for cough, dyspnea on exertion, sputum and wheezing  Cardiovascular: negative for chest pain, dyspnea, lower extremity edema, orthopnea, palpitations and syncope  Gastrointestinal: negative for abdominal pain, constipation, diarrhea, melena, nausea and vomiting  Genitourinary:negative for dysuria, frequency and hematuria  Hematologic/lymphatic: negative for bleeding, easy bruising and lymphadenopathy  Musculoskeletal:negative for arthralgias, muscle weakness , + stiff joints  Neurological: negative for coordination problems, gait problems, headaches and weakness  Endocrine: negative for diabetic symptoms including polydipsia, polyuria and weight loss     Objective:   Physical Exam  Gen. Pleasant, obese,  in no distress, normal affect ENT - no pallor,icterus, no post nasal drip, class 2 airway , large tonsils Neck: No JVD, no thyromegaly, no carotid bruits Lungs: no use of accessory muscles, no dullness to percussion, decreased without rales or rhonchi  Cardiovascular: Rhythm regular, heart sounds  normal, no murmurs or gallops, no peripheral edema Abdomen: soft and non-tender, no hepatosplenomegaly, BS normal. Musculoskeletal: No deformities, no cyanosis or clubbing Neuro:  alert, non focal, no tremors       Assessment & Plan:

## 2019-09-03 NOTE — Assessment & Plan Note (Signed)
Differential diagnosis includes OSA and narcolepsy in this age group. Given excessive daytime somnolence, narrow pharyngeal exam, witnessed apneas & loud snoring, obstructive sleep apnea is very likely & an overnight polysomnogram will be scheduled as a homestudy. The pathophysiology of obstructive sleep apnea , it's cardiovascular consequences & modes of treatment including CPAP were discused with the patient in detail & they evidenced understanding.  Pretest probability for OSA is high.  She has several red flags.  Hence reasonable to proceed with home sleep test first.  If no sleep disordered breathing is noted, then we will proceed with NPSG followed by MSLT to look for narcolepsy.  If she does end up having OSA then we will treat for OSA and if somnolence is not completely resolved and she may still need an MSLT

## 2019-09-03 NOTE — Assessment & Plan Note (Signed)
Seems adequately controlled with albuterol MDI

## 2019-09-18 ENCOUNTER — Encounter: Payer: Self-pay | Admitting: Women's Health

## 2019-09-18 ENCOUNTER — Ambulatory Visit (INDEPENDENT_AMBULATORY_CARE_PROVIDER_SITE_OTHER): Payer: 59 | Admitting: Women's Health

## 2019-09-18 ENCOUNTER — Other Ambulatory Visit: Payer: Self-pay

## 2019-09-18 VITALS — BP 122/81 | HR 81 | Ht 64.0 in | Wt 300.0 lb

## 2019-09-18 DIAGNOSIS — E282 Polycystic ovarian syndrome: Secondary | ICD-10-CM | POA: Diagnosis not present

## 2019-09-18 DIAGNOSIS — L0293 Carbuncle, unspecified: Secondary | ICD-10-CM

## 2019-09-18 DIAGNOSIS — B372 Candidiasis of skin and nail: Secondary | ICD-10-CM | POA: Diagnosis not present

## 2019-09-18 MED ORDER — SILVER SULFADIAZINE 1 % EX CREA
TOPICAL_CREAM | CUTANEOUS | 3 refills | Status: DC
Start: 2019-09-18 — End: 2019-12-20

## 2019-09-18 NOTE — Progress Notes (Signed)
   GYN VISIT Patient name: Frances Clark MRN [id number]  Date of birth: 02-Sep-2000 Chief Complaint:   Follow-up (Provera)  History of Present Illness:   Frances Clark is a 19 y.o. G0P0000 Caucasian female being seen today for f/u on provera.  Had visit w/ me 4/5 for no period x >63yr, dx PCOS, declined birth control and agreed upon provera q .  A1C and TSH were normal. Reports when stopped provera, had 3 days of spotting, then 11 days of heaving bleeding. No period since. Goes to beach in next few weeks, plans to repeat provera when she returns. Recurrent boils on mons, none active right now. Recurrent yeast in leg folds and under breasts, yeast cream helps. Gaining weight despite not eating a lot. Drinks mainly water. Wants diet for PCOS to see if that will help.  Depression screen [facility] 2/9 07/24/2019 06/18/2019 05/17/2019  Decreased Interest 2 3 3   Down, Depressed, Hopeless 1 3 2   PHQ - 2 Score 3 6 5   Altered sleeping 2 2 1   Tired, decreased energy 2 3 1   Change in appetite 2 3 3   Feeling bad or failure about yourself  1 3 2   Trouble concentrating 1 1 2   Moving slowly or fidgety/restless 1 2 1   Suicidal thoughts 1 2 1   PHQ-9 Score 13 22 16   Difficult doing work/chores - - Somewhat difficult    No LMP recorded. (Menstrual status: Irregular Periods). The current method of family planning is abstinence.  Last pap <21yo. Results were:  n/a Review of Systems:   Pertinent items are noted in HPI Denies fever/chills, dizziness, headaches, visual disturbances, fatigue, shortness of breath, chest pain, abdominal pain, vomiting, abnormal vaginal discharge/itching/odor/irritation, problems with periods, bowel movements, urination, or intercourse unless otherwise stated above.  Pertinent History Reviewed:  Reviewed past medical,surgical, social, obstetrical and family history.  Reviewed problem list, medications and allergies. Physical Assessment:   Vitals:   09/18/19 1109  BP: 122/81    Pulse: 81  Weight: 300 lb (136.1 kg)  Height: 5\' 4"  (1.626 m)  Body mass index is 51.49 kg/m.       Physical Examination:   General appearance: alert, well appearing, and in no distress  Mental status: alert, oriented to person, place, and time  Skin: warm & dry   Cardiovascular: normal heart rate noted  Respiratory: normal respiratory effort, no distress  Abdomen: soft, non-tender   Pelvic: examination not indicated  Extremities: no edema   Chaperone: n/a    No results found for this or any previous visit (from the past 24 hour(s)).  Assessment & Plan:  1) PCOS> continue cycling q w/ provera to prevent endometrial proliferation. PCOS diet info given  2) Recurrent boils on mons> none active, rx silvadene to try 1-2x/day  3) Recurrent yeast skin folds> keep areas as dry as possible, use deodorant on these areas daily  Meds:  Meds ordered this encounter  Medications  . silver sulfADIAZINE (SILVADENE) 1 % cream    Sig: Apply to affected areas 1-2 times daily    Dispense:  50 g    Refill:  3    Order Specific Question:   Supervising Provider    Answer:   , LUTHER H [[id number]]    No orders of the defined types were placed in this encounter.   Return in about 1 year (around 09/17/2020) for F/U.  CNM, [facility] 09/18/2019 1:06 PM

## 2019-09-18 NOTE — Patient Instructions (Signed)
Diet for Polycystic Ovary Syndrome °Polycystic ovary syndrome (PCOS) is a disorder of the chemicals (hormones) that regulate a woman's reproductive system, including monthly periods (menstruation). The condition causes important hormones to be out of balance. PCOS can: °· Stop your periods or make them irregular. °· Cause cysts to develop on your ovaries. °· Make it difficult to get pregnant. °· Stop your body from responding to the effects of insulin (insulin resistance). Insulin resistance can lead to obesity and diabetes. °Changing what you eat can help you manage PCOS and improve your health. Following a balanced diet can help you lose weight and improve the way that your body uses insulin. °What are tips for following this plan? °· Follow a balanced diet for meals and snacks. Eat breakfast, lunch, dinner, and one or two snacks every day. °· Include protein in each meal and snack. °· Choose whole grains instead of products that are made with refined flour. °· Eat a variety of foods. °· Exercise regularly as told by your health care provider. Aim to do 30 or more minutes of exercise on most days of the week. °· If you are overweight or obese: °? Pay attention to how many calories you eat. Cutting down on calories can help you lose weight. °? Work with your health care provider or a diet and nutrition specialist (dietitian) to figure out how many calories you need each day. °What foods can I eat? ° °Fruits °Include a variety of colors and types. All fruits are helpful for PCOS. °Vegetables °Include a variety of colors and types. All vegetables are helpful for PCOS. °Grains °Whole grains, such as whole wheat. Whole-grain breads, crackers, cereals, and pasta. Unsweetened oatmeal, bulgur, barley, quinoa, and brown rice. Tortillas made from corn or whole-wheat flour. °Meats and other proteins °Low-fat (lean) proteins, such as fish, chicken, beans, eggs, and tofu. °Dairy °Low-fat dairy products, such as skim milk,  cheese sticks, and yogurt. °Beverages °Low-fat or fat-free drinks, such as water, low-fat milk, sugar-free drinks, and small amounts of 100% fruit juice. °Seasonings and condiments °Ketchup. Mustard. Barbecue sauce. Relish. Low-fat or fat-free mayonnaise. °Fats and oils °Olive oil or canola oil. Walnuts and almonds. °The items listed above may not be a complete list of recommended foods and beverages. Contact a dietitian for more options. °What foods are not recommended? °Foods that are high in calories or fat. Fried foods. Sweets. Products that are made from refined white flour, including white bread, pastries, white rice, and pasta. °The items listed above may not be a complete list of foods and beverages to avoid. Contact a dietitian for more information. °Summary °· PCOS is a hormonal imbalance that affects a woman's reproductive system. °· You can help to manage your PCOS by exercising regularly and eating a healthy, varied diet of vegetables, fruit, whole grains, low-fat (lean) protein, and low-fat dairy products. °· Changing what you eat can improve the way that your body uses insulin, help your hormones reach normal levels, and help you lose weight. °This information is not intended to replace advice given to you by your health care provider. Make sure you discuss any questions you have with your health care provider. °Document Revised: 06/21/2018 Document Reviewed: 01/03/2017 °Elsevier Patient Education © 2020 [employer] ° °

## 2019-09-24 ENCOUNTER — Telehealth: Payer: Self-pay | Admitting: Family Medicine

## 2019-09-24 NOTE — Telephone Encounter (Signed)
Appt scheduled for tomorrow per patients request. Left detailed message on patients voicemail with date and time. Advised to contact office if the appt time will not work for her.

## 2019-09-25 ENCOUNTER — Other Ambulatory Visit: Payer: Self-pay

## 2019-09-25 ENCOUNTER — Ambulatory Visit (INDEPENDENT_AMBULATORY_CARE_PROVIDER_SITE_OTHER): Payer: 59 | Admitting: Nurse Practitioner

## 2019-09-25 ENCOUNTER — Encounter: Payer: Self-pay | Admitting: Nurse Practitioner

## 2019-09-25 VITALS — BP 120/87 | HR 96 | Temp 97.7°F | Ht 64.0 in | Wt 297.8 lb

## 2019-09-25 DIAGNOSIS — L732 Hidradenitis suppurativa: Secondary | ICD-10-CM

## 2019-09-25 DIAGNOSIS — H6012 Cellulitis of left external ear: Secondary | ICD-10-CM

## 2019-09-25 MED ORDER — CEPHALEXIN 500 MG PO CAPS: 500.0000 mg | ORAL_CAPSULE | Freq: Two times a day (BID) | ORAL | 0 refills | Status: DC

## 2019-09-25 MED ORDER — CEFTRIAXONE SODIUM 1 G IJ SOLR
1.0000 g | Freq: Once | INTRAMUSCULAR | Status: AC
  Administered 2019-09-25: 1 g via INTRAMUSCULAR

## 2019-09-25 NOTE — Assessment & Plan Note (Signed)
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  This is not new for patient but recurrent.  Patient reports trying everything she knows how to avoid new breakouts.  Patient reports not using blades underneath her skin, and sensitive deodorants and soap.  Patient is reporting pain, itching, discomfort and skin discoloration.  Education provided to patient, with printed handout.  Advised patient to apply warm compress, NSAIDs. Rx sent to pharmacy. Advised patient to follow-up with worsening or unresolved symptoms.

## 2019-09-25 NOTE — Patient Instructions (Addendum)
Cellulitis, Adult  Cellulitis is a skin infection. The infected area is often warm, red, swollen, and sore. It occurs most often in the arms and lower legs. It is very important to get treated for this condition. What are the causes? This condition is caused by bacteria. The bacteria enter through a break in the skin, such as a cut, burn, insect bite, open sore, or crack. What increases the risk? This condition is more likely to occur in people who:  Have a weak body defense system (immune system).  Have open cuts, burns, bites, or scrapes on the skin.  Are older than 20 years of age.  Have a blood sugar problem (diabetes).  Have a long-lasting (chronic) liver disease (cirrhosis) or kidney disease.  Are very overweight (obese).  Have a skin problem, such as: ? Itchy rash (eczema). ? Slow movement of blood in the veins (venous stasis). ? Fluid buildup below the skin (edema).  Have been treated with high-energy rays (radiation).  Use IV drugs. What are the signs or symptoms? Symptoms of this condition include:  Skin that is: ? Red. ? Streaking. ? Spotting. ? Swollen. ? Sore or painful when you touch it. ? Warm.  A fever.  Chills.  Blisters. How is this diagnosed? This condition is diagnosed based on:  Medical history.  Physical exam.  Blood tests.  Imaging tests. How is this treated? Treatment for this condition may include:  Medicines to treat infections or allergies.  Home care, such as: ? Rest. ? Placing cold or warm cloths (compresses) on the skin.  Hospital care, if the condition is very bad. Follow these instructions at home: Medicines  Take over-the-counter and prescription medicines only as told by your doctor.  If you were prescribed an antibiotic medicine, take it as told by your doctor. Do not stop taking it even if you start to feel better. General instructions   Drink enough fluid to keep your pee (urine) pale yellow.  Do not touch  or rub the infected area.  Raise (elevate) the infected area above the level of your heart while you are sitting or lying down.  Place cold or warm cloths on the area as told by your doctor.  Keep all follow-up visits as told by your doctor. This is important. Contact a doctor if:  You have a fever.  You do not start to get better after 1-2 days of treatment.  Your bone or joint under the infected area starts to hurt after the skin has healed.  Your infection comes back. This can happen in the same area or another area.  You have a swollen bump in the area.  You have new symptoms.  You feel ill and have muscle aches and pains. Get help right away if:  Your symptoms get worse.  You feel very sleepy.  You throw up (vomit) or have watery poop (diarrhea) for a long time.  You see red streaks coming from the area.  Your red area gets larger.  Your red area turns dark in color. These symptoms may represent a serious problem that is an emergency. Do not wait to see if the symptoms will go away. Get medical help right away. Call your local emergency services (911 in the [place] Do not drive yourself to the hospital. Summary  Cellulitis is a skin infection. The area is often warm, red, swollen, and sore.  This condition is treated with medicines, rest, and cold and warm cloths.  Take all medicines only   as told by your doctor.  Tell your doctor if symptoms do not start to get better after 1-2 days of treatment. This information is not intended to replace advice given to you by your health care provider. Make sure you discuss any questions you have with your health care provider. Document Revised: 07/21/2017 Document Reviewed: 07/21/2017 Elsevier Patient Education  2020 [employer] Skin Abscess  A skin abscess is an infected area on or under your skin that contains a collection of pus and other material. An abscess may also be called a furuncle, carbuncle, or boil. An abscess  can occur in or on almost any part of your body. Some abscesses break open (rupture) on their own. Most continue to get worse unless they are treated. The infection can spread deeper into the body and eventually into your blood, which can make you feel ill. Treatment usually involves draining the abscess. What are the causes? An abscess occurs when germs, like bacteria, pass through your skin and cause an infection. This may be caused by:  A scrape or cut on your skin.  A puncture wound through your skin, including a needle injection or insect bite.  Blocked oil or sweat glands.  Blocked and infected hair follicles.  A cyst that forms beneath your skin (sebaceous cyst) and becomes infected. What increases the risk? This condition is more likely to develop in people who:  Have a weak body defense system (immune system).  Have diabetes.  Have dry and irritated skin.  Get frequent injections or use illegal IV drugs.  Have a foreign body in a wound, such as a splinter.  Have problems with their lymph system or veins. What are the signs or symptoms? Symptoms of this condition include:  A painful, firm bump under the skin.  A bump with pus at the top. This may break through the skin and drain. Other symptoms include:  Redness surrounding the abscess site.  Warmth.  Swelling of the lymph nodes (glands) near the abscess.  Tenderness.  A sore on the skin. How is this diagnosed? This condition may be diagnosed based on:  A physical exam.  Your medical history.  A sample of pus. This may be used to find out what is causing the infection.  Blood tests.  Imaging tests, such as an ultrasound, CT scan, or MRI. How is this treated? A small abscess that drains on its own may not need treatment. Treatment for larger abscesses may include:  Moist heat or heat pack applied to the area several times a day.  A procedure to drain the abscess (incision and  drainage).  Antibiotic medicines. For a severe abscess, you may first get antibiotics through an IV and then change to antibiotics by mouth. Follow these instructions at home: Medicines   Take over-the-counter and prescription medicines only as told by your health care provider.  If you were prescribed an antibiotic medicine, take it as told by your health care provider. Do not stop taking the antibiotic even if you start to feel better. Abscess care   If you have an abscess that has not drained, apply heat to the affected area. Use the heat source that your health care provider recommends, such as a moist heat pack or a heating pad. ? Place a towel between your skin and the heat source. ? Leave the heat on for 20-30 minutes. ? Remove the heat if your skin turns bright red. This is especially important if you are unable to feel  pain, heat, or cold. You may have a greater risk of getting burned.  Follow instructions from your health care provider about how to take care of your abscess. Make sure you: ? Cover the abscess with a bandage (dressing). ? Change your dressing or gauze as told by your health care provider. ? Wash your hands with soap and water before you change the dressing or gauze. If soap and water are not available, use hand sanitizer.  Check your abscess every day for signs of a worsening infection. Check for: ? More redness, swelling, or pain. ? More fluid or blood. ? Warmth. ? More pus or a bad smell. General instructions  To avoid spreading the infection: ? Do not share personal care items, towels, or hot tubs with others. ? Avoid making skin contact with other people.  Keep all follow-up visits as told by your health care provider. This is important. Contact a health care provider if you have:  More redness, swelling, or pain around your abscess.  More fluid or blood coming from your abscess.  Warm skin around your abscess.  More pus or a bad smell coming  from your abscess.  A fever.  Muscle aches.  Chills or a general ill feeling. Get help right away if you:  Have severe pain.  See red streaks on your skin spreading away from the abscess. Summary  A skin abscess is an infected area on or under your skin that contains a collection of pus and other material.  A small abscess that drains on its own may not need treatment.  Treatment for larger abscesses may include having a procedure to drain the abscess and taking an antibiotic. This information is not intended to replace advice given to you by your health care provider. Make sure you discuss any questions you have with your health care provider. Document Revised: 06/22/2018 Document Reviewed: 04/14/2017 Elsevier Patient Education  2020 [employer].

## 2019-09-25 NOTE — Progress Notes (Signed)
Acute Office Visit  Subjective:    Patient ID: Frances Clark, adult    DOB: 2000/09/07, 19 y.o.   MRN: [id number]  Chief Complaint  Patient presents with  . Ear Problem    left piercing infected  . Cyst    armpit nodules off and on    HPI Patient is in today for left piercing infection.  Patient reports having her ear pierced in January and since then ears have not properly healed, she has been having purulent drainage, swelling, pain and erythematous skin behind left ear.  Patient is not reporting any fever.  Axillary Hidradenitis suppurativa:  Patient is reporting reoccurring hidradenitis suppurativa of bilateral axillary area.  This is not new for patient but recurrent.  Patient reports trying everything she knows how like not using blades or strong deodorants under her armpit but has not had any successful outcome.  Patient is reporting pain, itching, discomfort, and skin discoloration.     Past Medical History:  Diagnosis Date  . ADHD (attention deficit hyperactivity disorder)   . Anxiety   . Asthma   . Bipolar 2 disorder (HCC)   . Depression   . Polycystic ovarian syndrome     Past Surgical History:  Procedure Laterality Date  . CHOLECYSTECTOMY N/A 10/20/2018   Procedure: LAPAROSCOPIC CHOLECYSTECTOMY;  Surgeon: Axel Filler, MD;  Location: [facility] OR;  Service: General;  Laterality: N/A;  . ENDOSCOPIC RETROGRADE CHOLANGIOPANCREATOGRAPHY (ERCP) WITH PROPOFOL N/A 10/21/2018   Procedure: ENDOSCOPIC RETROGRADE CHOLANGIOPANCREATOGRAPHY (ERCP) WITH PROPOFOL;  Surgeon: Vida Rigger, MD;  Location: [facility] ENDOSCOPY;  Service: Endoscopy;  Laterality: N/A;  . REMOVAL OF STONES  10/21/2018   Procedure: REMOVAL OF STONES;  Surgeon: Vida Rigger, MD;  Location: [facility] ENDOSCOPY;  Service: Endoscopy;;  . Dennison Mascot  10/21/2018   Procedure: Dennison Mascot;  Surgeon: Vida Rigger, MD;  Location: [facility] ENDOSCOPY;  Service: Endoscopy;;  . WISDOM TOOTH EXTRACTION      Family History  Problem  Relation Age of Onset  . Anxiety disorder Mother   . Drug abuse Mother   . Anxiety disorder Maternal Aunt   . Anxiety disorder Maternal Grandmother   . Alcohol abuse Maternal Grandmother   . Alcohol abuse Paternal Grandmother   . Depression Paternal Grandmother   . Anxiety disorder Paternal Grandmother   . Drug abuse Maternal Aunt   . Asthma Sister   . Diabetes Maternal Grandfather   . Heart disease Paternal Grandfather     Social History   Socioeconomic History  . Marital status: Single    Spouse name: Not on file  . Number of children: Not on file  . Years of education: Not on file  . Highest education level: Not on file  Occupational History  . Not on file  Tobacco Use  . Smoking status: Passive Smoke Exposure - Never Smoker  . Smokeless tobacco: Never Used  Vaping Use  . Vaping Use: Never used  Substance and Sexual Activity  . Alcohol use: Yes    Comment: twice a month  . Drug use: No  . Sexual activity: Never    Birth control/protection: None  Other Topics Concern  . Not on file  Social History Narrative  . Not on file   Social Determinants of Health   Financial Resource Strain: Low Risk   . Difficulty of Paying Living Expenses: Not very hard  Food Insecurity: No Food Insecurity  . Worried About Programme researcher, broadcasting/film/video in the Last Year: Never true  . Ran Out of  Food in the Last Year: Never true  Transportation Needs: No Transportation Needs  . Lack of Transportation (Medical): No  . Lack of Transportation (Non-Medical): No  Physical Activity: Insufficiently Active  . Days of Exercise per Week: 1 day  . Minutes of Exercise per Session: 30 min  Stress: Stress Concern Present  . Feeling of Stress : Rather much  Social Connections: Socially Isolated  . Frequency of Communication with Friends and Family: Once a week  . Frequency of Social Gatherings with Friends and Family: Once a week  . Attends Religious Services: Never  . Active Member of Clubs or  Organizations: No  . Attends Banker Meetings: Never  . Marital Status: Never married  Intimate Partner Violence: Not At Risk  . Fear of Current or Ex-Partner: No  . Emotionally Abused: No  . Physically Abused: No  . Sexually Abused: No    Outpatient Medications Prior to Visit  Medication Sig Dispense Refill  . acetaminophen (TYLENOL) 500 MG tablet Take 1,000 mg by mouth every 6 (six) hours as needed for mild pain.    Marland Kitchen albuterol (PROVENTIL) (5 MG/ML) 0.5% nebulizer solution Inhale 0.5 mLs into the lungs every 4 (four) hours as needed for wheezing.    Marland Kitchen albuterol (VENTOLIN HFA) 108 (90 Base) MCG/ACT inhaler Inhale 2 puffs into the lungs every 6 (six) hours as needed for wheezing or shortness of breath. 18 g 5  . amphetamine-dextroamphetamine (ADDERALL XR) 5 MG 24 hr capsule Take 1 capsule by mouth daily.    . clonazePAM (KLONOPIN) 0.5 MG tablet Take 0.5 mg by mouth daily as needed.    . DULoxetine (CYMBALTA) 60 MG capsule Take 1 capsule (60 mg total) by mouth daily. (Patient taking differently: Take 120 mg by mouth daily. ) 90 capsule 2  . fluticasone (FLONASE) 50 MCG/ACT nasal spray SPRAY 2 SPRAYS INTO EACH NOSTRIL EVERY DAY 16 mL 5  . ibuprofen (ADVIL,MOTRIN) 200 MG tablet Take 400 mg by mouth daily as needed for headache.    . lamoTRIgine (LAMICTAL) 25 MG tablet Take 1 tablet by mouth daily.    . medroxyPROGESTERone (PROVERA) 10 MG tablet Take 1 tablet (10 mg total) by mouth daily. X 10days every 3 months 10 tablet 3  . mometasone (ELOCON) 0.1 % cream Apply 1 application topically daily. 45 g 0  . silver sulfADIAZINE (SILVADENE) 1 % cream Apply to affected areas 1-2 times daily 50 g 3   No facility-administered medications prior to visit.    Allergies  Allergen Reactions  . Other     CATS    Review of Systems  Constitutional: Negative.   HENT: Negative.   Eyes: Negative.   Respiratory: Negative.   Cardiovascular: Negative.   Genitourinary: Negative.     Musculoskeletal: Negative.   Skin: Positive for color change.       abscess   Neurological: Negative.        Objective:    Physical Exam Vitals reviewed.  Constitutional:      Appearance: Normal appearance.  HENT:     Head: Normocephalic.  Eyes:     Conjunctiva/sclera: Conjunctivae normal.  Cardiovascular:     Rate and Rhythm: Normal rate and regular rhythm.     Pulses: Normal pulses.     Heart sounds: Normal heart sounds.  Pulmonary:     Effort: Pulmonary effort is normal.     Breath sounds: Normal breath sounds.  Musculoskeletal:     Cervical back: Neck supple.  Skin:  General: Skin is warm.     Findings: Erythema and rash present.  Neurological:     Mental Status: Frances Clark is alert and oriented to person, place, and time.  Psychiatric:        Mood and Affect: Mood normal.        Behavior: Behavior normal.     BP 120/87   Pulse 96   Temp 97.7 F (36.5 C)   Ht 5\' 4"  (1.626 m)   Wt 297 lb 12.8 oz (135.1 kg)   SpO2 97%   BMI 51.12 kg/m  Wt Readings from Last 3 Encounters:  09/25/19 297 lb 12.8 oz (135.1 kg) (>99 %, Z= 2.85)*  09/18/19 300 lb (136.1 kg) (>99 %, Z= 2.86)*  09/03/19 280 lb (127 kg) (>99 %, Z= 2.74)*   * Growth percentiles are based on CDC (Girls, 2-20 Years) data.    Health Maintenance Due  Topic Date Due  . Hepatitis C Screening  Never done  . COVID-19 Vaccine (2 - Moderna 2-dose series) 07/16/2019    There are no preventive care reminders to display for this patient.   Lab Results  Component Value Date   TSH 3.760 06/18/2019   Lab Results  Component Value Date   WBC 9.0 10/22/2018   HGB 10.7 (L) 10/22/2018   HCT 34.6 (L) 10/22/2018   MCV 88.3 10/22/2018   PLT 330 10/22/2018   Lab Results  Component Value Date   NA 140 10/22/2018   K 3.7 10/22/2018   CO2 23 10/22/2018   GLUCOSE 114 (H) 10/22/2018   BUN 6 10/22/2018   CREATININE 0.69 10/22/2018   BILITOT 0.6 10/22/2018   ALKPHOS 119 10/22/2018   AST 343 (H)  10/22/2018   ALT 611 (H) 10/22/2018   PROT 6.0 (L) 10/22/2018   ALBUMIN 3.2 (L) 10/22/2018   CALCIUM 8.7 (L) 10/22/2018   ANIONGAP 11 10/22/2018   Lab Results  Component Value Date   CHOL 135 03/11/2016   Lab Results  Component Value Date   HDL 47 03/11/2016   Lab Results  Component Value Date   LDLCALC 71 03/11/2016   Lab Results  Component Value Date   TRIG 85 03/11/2016   Lab Results  Component Value Date   CHOLHDL 2.9 03/11/2016   Lab Results  Component Value Date   HGBA1C 5.6 06/18/2019   Cellulitis of external ear, left Patient is a 19 year old female who presents to clinic today with cellulitis of external left ear.  Patient reports piercing her ear January 2021 and since then ears have not properly healed.  She has been having purulent drainage, swelling, pain and erythematous skin.-Patient is not reporting any fever today.  Provided education to patient with printed handout. Shot of Rocephin given in office, Started patient on Keflex 500 mg twice daily. Rx sent to pharmacy Follow-up with unresolved or worsening symptoms.   Axillary hidradenitis suppurativa Is reporting reoccurring hydradenitis suppurativa of bilateral axillary area.  This is not new for patient but recurrent.  Patient reports trying everything she knows how to avoid new breakouts.  Patient reports not using blades underneath her skin, and sensitive deodorants and soap.  Patient is reporting pain, itching, discomfort and skin discoloration.  Education provided to patient, with printed handout.  Advised patient to apply warm compress, NSAIDs. Rx sent to pharmacy. Advised patient to follow-up with worsening or unresolved symptoms.     Assessment & Plan:   Problem List Items Addressed This Visit  Nervous and Auditory   Cellulitis of external ear, left - Primary    Patient is a 19 year old female who presents to clinic today with cellulitis of external left ear.  Patient reports piercing her  ear January 2021 and since then ears have not properly healed.  She has been having purulent drainage, swelling, pain and erythematous skin.-Patient is not reporting any fever today.  Provided education to patient with printed handout. Shot of Rocephin given in office, Started patient on Keflex 500 mg twice daily. Rx sent to pharmacy Follow-up with unresolved or worsening symptoms.       Relevant Medications   cephALEXin (KEFLEX) 500 MG capsule     Musculoskeletal and Integument   Axillary hidradenitis suppurativa   Relevant Medications   cephALEXin (KEFLEX) 500 MG capsule       Meds ordered this encounter  Medications  . cefTRIAXone (ROCEPHIN) injection 1 g  . cephALEXin (KEFLEX) 500 MG capsule    Sig: Take 1 capsule (500 mg total) by mouth 2 (two) times daily.    Dispense:  20 capsule    Refill:  0    Order Specific Question:   Supervising Provider    Answer:   Arville Care A [[id number]]     Daryll Drown, NP

## 2019-09-25 NOTE — Assessment & Plan Note (Signed)
Patient is a 19 year old female who presents to clinic today with cellulitis of external left ear.  Patient reports piercing her ear January 2021 and since then ears have not properly healed.  She has been having purulent drainage, swelling, pain and erythematous skin.-Patient is not reporting any fever today.  Provided education to patient with printed handout. Shot of Rocephin given in office, Started patient on Keflex 500 mg twice daily. Rx sent to pharmacy Follow-up with unresolved or worsening symptoms.

## 2019-10-05 ENCOUNTER — Ambulatory Visit (INDEPENDENT_AMBULATORY_CARE_PROVIDER_SITE_OTHER): Payer: 59 | Admitting: Family

## 2019-10-05 ENCOUNTER — Encounter: Payer: Self-pay | Admitting: Family

## 2019-10-05 DIAGNOSIS — M5430 Sciatica, unspecified side: Secondary | ICD-10-CM

## 2019-10-05 DIAGNOSIS — M549 Dorsalgia, unspecified: Secondary | ICD-10-CM

## 2019-10-05 MED ORDER — DICLOFENAC SODIUM 75 MG PO TBEC: 75.0000 mg | DELAYED_RELEASE_TABLET | Freq: Two times a day (BID) | ORAL | 2 refills | Status: DC

## 2019-10-05 NOTE — Progress Notes (Signed)
Virtual Visit via telephone Note Due to COVID-19 pandemic this visit was conducted virtually. This visit type was conducted due to national recommendations for restrictions regarding the COVID-19 Pandemic (e.g. social distancing, sheltering in place) in an effort to limit this patient's exposure and mitigate transmission in our community. All issues noted in this document were discussed and addressed.  A physical exam was not performed with this format.  I connected with Frances Clark on 10/05/19 at 8:48 AM by telephone and verified that I am speaking with the correct person using two identifiers. Frances Clark is currently located at home and no one is currently with her during visit. The provider, Jannifer Rodney, FNP is located in their office at time of visit.  I discussed the limitations, risks, security and privacy concerns of performing an evaluation and management service by telephone and the availability of in person appointments. I also discussed with the patient that there may be a patient responsible charge related to this service. The patient expressed understanding and agreed to proceed.   History and Present Illness:  Pt calls the office today with chronic back pain that started over a year ago. She reports any time she walks longer than 20 mins she has significant pain. She had a visit on 07/24/19 and was prescribed prednisone. She reports this help slighty, but returned. Back Pain This is a chronic problem. The current episode started more than 1 year ago. The problem occurs intermittently. The problem has been waxing and waning since onset. The pain is present in the lumbar spine and thoracic spine. The quality of the pain is described as aching. The pain radiates to the left thigh and right thigh. The pain is at a severity of 6/10. The pain is moderate. The symptoms are aggravated by standing and twisting. Associated symptoms include leg pain. Pertinent negatives include no  bladder incontinence, bowel incontinence, headaches, numbness, paresthesias, pelvic pain or perianal numbness. Frances Clark has tried NSAIDs for the symptoms. The treatment provided mild relief.        Review of Systems  Gastrointestinal: Negative for bowel incontinence.  Genitourinary: Negative for bladder incontinence and pelvic pain.  Musculoskeletal: Positive for back pain.  Neurological: Negative for numbness, headaches and paresthesias.  All other systems reviewed and are negative.    Observations/Objective: No SOB or distress noted   Assessment and Plan: 1. Back pain with sciatica Rest Ice  ROM exercises  No other NSAID"s while taking Diclofenac Call if symptoms worsen or do not improve  - DG Lumbar Spine 2-3 Views; Future - diclofenac (VOLTAREN) 75 MG EC tablet; Take 1 tablet (75 mg total) by mouth 2 (two) times daily.  Dispense: 60 tablet; Refill: 2 - Ambulatory referral to Physical Therapy     I discussed the assessment and treatment plan with the patient. The patient was provided an opportunity to ask questions and all were answered. The patient agreed with the plan and demonstrated an understanding of the instructions.   The patient was advised to call back or seek an in-person evaluation if the symptoms worsen or if the condition fails to improve as anticipated.  The above assessment and management plan was discussed with the patient. The patient verbalized understanding of and has agreed to the management plan. Patient is aware to call the clinic if symptoms persist or worsen. Patient is aware when to return to the clinic for a follow-up visit. Patient educated on when it is appropriate to go to the  emergency department.   Time call ended:  9:00 AM  I provided 12 minutes of non-face-to-face time during this encounter.    Jannifer Rodney, FNP

## 2019-10-09 ENCOUNTER — Other Ambulatory Visit (INDEPENDENT_AMBULATORY_CARE_PROVIDER_SITE_OTHER): Payer: 59

## 2019-10-09 DIAGNOSIS — M5430 Sciatica, unspecified side: Secondary | ICD-10-CM | POA: Diagnosis not present

## 2019-10-09 DIAGNOSIS — M545 Low back pain: Secondary | ICD-10-CM | POA: Diagnosis not present

## 2019-10-09 DIAGNOSIS — M549 Dorsalgia, unspecified: Secondary | ICD-10-CM

## 2019-10-14 DIAGNOSIS — L0291 Cutaneous abscess, unspecified: Secondary | ICD-10-CM | POA: Diagnosis not present

## 2019-10-15 ENCOUNTER — Ambulatory Visit: Payer: 59 | Attending: Family | Admitting: Physical Therapy

## 2019-10-15 ENCOUNTER — Encounter: Payer: Self-pay | Admitting: Physical Therapy

## 2019-10-15 ENCOUNTER — Other Ambulatory Visit: Payer: Self-pay

## 2019-10-15 DIAGNOSIS — M5442 Lumbago with sciatica, left side: Secondary | ICD-10-CM | POA: Insufficient documentation

## 2019-10-15 DIAGNOSIS — M5441 Lumbago with sciatica, right side: Secondary | ICD-10-CM | POA: Insufficient documentation

## 2019-10-15 DIAGNOSIS — G8929 Other chronic pain: Secondary | ICD-10-CM | POA: Insufficient documentation

## 2019-10-15 DIAGNOSIS — R293 Abnormal posture: Secondary | ICD-10-CM

## 2019-10-15 NOTE — Therapy (Signed)
[facility] [address] [place], [place], [address] Phone: [phone]   Fax:  [phone]  Physical Therapy Evaluation  Patient Details  Name: Frances Clark MRN: [id number] Date of Birth: 01/26/2001 Referring Provider (PT): Jannifer Rodneyhristy Hawks, FNP   Encounter Date: 10/15/2019   PT End of Session - 10/15/19 1507    Visit Number 1    Number of Visits 12    Date for PT Re-Evaluation 12/03/19    Authorization Type Medicaid    PT Start Time 1115    PT Stop Time 1158    PT Time Calculation (min) 43 min    Activity Tolerance Patient tolerated treatment well    Behavior [facility] tasks assessed/performed           Past Medical History:  Diagnosis Date  . ADHD (attention deficit hyperactivity disorder)   . Anxiety   . Asthma   . Bipolar 2 disorder (HCC)   . Depression   . Polycystic ovarian syndrome     Past Surgical History:  Procedure Laterality Date  . CHOLECYSTECTOMY N/A 10/20/2018   Procedure: LAPAROSCOPIC CHOLECYSTECTOMY;  Surgeon: Axel Filleramirez, Armando, MD;  [facility];  Service: General;  Laterality: N/A;  . ENDOSCOPIC RETROGRADE CHOLANGIOPANCREATOGRAPHY (ERCP) WITH PROPOFOL N/A 10/21/2018   Procedure: ENDOSCOPIC RETROGRADE CHOLANGIOPANCREATOGRAPHY (ERCP) WITH PROPOFOL;  Surgeon: Vida RiggerMagod, Marc, MD;  [facility];  Service: Endoscopy;  Laterality: N/A;  . REMOVAL OF STONES  10/21/2018   Procedure: REMOVAL OF STONES;  Surgeon: Vida RiggerMagod, Marc, MD;  [facility];  Service: Endoscopy;;  . Dennison MascotSPHINCTEROTOMY  10/21/2018   Procedure: Dennison MascotSPHINCTEROTOMY;  Surgeon: Vida RiggerMagod, Marc, MD;  [facility];  Service: Endoscopy;;  . WISDOM TOOTH EXTRACTION      There were no vitals filed for this visit.    Subjective Assessment - 10/15/19 1501    Subjective COVID-19 screening performed upon arrival.Patient arrives to physical therapy with reports of chronic history of low back pain with numbness, shooting pain to  bilateral knees that began over one year ago. Patient reports increase pain with walking which makes grocery shopping difficult. Patient frequently lies down with her legs propped up to decrease pain. Patient reports ability to perform ADLs independently but has roommates assist with household chores as needed. Patient reports pain at worst as 6/10 and pain at best as 2/10 with rest, Tylenol and Ibuprofen. Patient's goals are to decrease pain improve movement, improve walking and standing tolerance, and have less difficulties with performing ADLS and home activities.    Pertinent History ADHD, Anxiety, Asthma, Bipolar Disorder 2, Depression    Limitations Sitting;Lifting;Walking;Standing;House hold activities    How long can you sit comfortably? "few hours"    How long can you stand comfortably? "5-10 minutes"    How long can you walk comfortably? "<10 minutes"    Diagnostic tests x-ray: normal results    Patient Stated Goals return to PLOF    Currently in Pain? Yes    Pain Score 4     Pain Location Back    Pain Orientation Lower    Pain Descriptors / Indicators Aching;Shooting    Pain Type Chronic pain    Pain Radiating Towards bilateral lateral knees    Pain Onset More than a month ago    Pain Frequency Constant    Aggravating Factors  "walking, bending, sitting uncomfortably"    Pain Relieving Factors "laying down, OTC pain medication"    Effect of Pain on Daily Activities "I avoid going  places to walk or chores"              [facility] PT Assessment - 10/15/19 0001      Assessment   Medical Diagnosis Back pain with sciatica    Referring Provider (PT) Jannifer Rodney, FNP    Onset Date/Surgical Date --   ongoing over 1 year   Next MD Visit n/a    Prior Therapy no      Precautions   Precautions None      Restrictions   Weight Bearing Restrictions No      Balance Screen   Has the patient fallen in the past 6 months No    Has the patient had a decrease in activity level because of  a fear of falling?  No    Is the patient reluctant to leave their home because of a fear of falling?  No      Home Environment   Living Environment Private residence    Living Arrangements Non-relatives/Friends    Type of Home Apartment    Home Access Stairs to enter      Prior Function   Level of Independence Independent with basic ADLs;Needs assistance with homemaking    Vocation Requirements Works at call center      Deep Tendon Reflexes   DTR Assessment Site Patella;Achilles    Patella DTR 2+    Achilles DTR 2+   left, 1+     ROM / Strength   AROM / PROM / Strength AROM;Strength      AROM   Overall AROM  Deficits;Due to pain    AROM Assessment Site Lumbar    Lumbar Flexion finger tips to floor    Lumbar - Right Side Bend 21.5" finger tip to floor    Lumbar - Left Side Bend 21.5" finger tip to floor      Strength   Overall Strength Within functional limits for tasks performed    Strength Assessment Site Hip;Knee    Right/Left Hip Right;Left    Right Hip Flexion 4+/5    Right Hip Extension 4-/5    Right Hip ABduction 4/5    Left Hip Flexion 4+/5    Left Hip Extension 4-/5    Left Hip ABduction 4/5    Right/Left Knee Right;Left    Right Knee Flexion 4+/5    Right Knee Extension 5/5    Left Knee Flexion 4+/5    Left Knee Extension 5/5      Palpation   SI assessment  R LE >L LE upon assessment    Palpation comment minimal tenderness to palpation but notable tone in lumbar paraspinals and QL; increased neurological symptoms with deep palpation to QL/upper glute      Special Tests    Special Tests Lumbar    Lumbar Tests Straight Leg Raise      Straight Leg Raise   Findings Negative    Side  Left    Comment bilaterally      Ambulation/Gait   Gait Pattern Step-through pattern;Wide base of support;Decreased stride length                      Objective measurements completed on examination: See above findings.               PT Education  - 10/15/19 1505    Education Details draw ins, sitting pelvic tilts, figure 4 stretch    Person(s) Educated Patient    Methods Explanation;Demonstration;Handout  Comprehension Verbalized understanding;Returned demonstration            PT Short Term Goals - 10/15/19 1755      PT SHORT TERM GOAL #1   Title Patient will be independent with HEP    Baseline no knowledge of HEP    Time 3    Period Weeks    Status New      PT SHORT TERM GOAL #2   Title Patient will demonstrate proper log rolling bed mobility mechanics to protects spine during bed transitions.    Baseline supine to long sit transfer.    Time 3    Period Weeks    Status New             PT Long Term Goals - 10/15/19 1757      PT LONG TERM GOAL #1   Title Patient will be independent with advanced HEP    Baseline no knowledge of HEP    Time 6    Period Weeks    Status New      PT LONG TERM GOAL #2   Title Patient will report ability to walk around community for 20 mins or greater with low back pain less than or equal to 3/10    Baseline 5-10 minute average pain can increase to 8/10    Time 6    Period Weeks    Status New      PT LONG TERM GOAL #3   Title Patient will report a centralization of neurological symptoms to low back or report no neurological symptoms to indicate no nerve irritation.    Baseline bilateral neurological symptoms to lateral knee    Time 6    Period Weeks    Status New      PT LONG TERM GOAL #4   Title Patient will report ability to perform ADLs with low back pain less than or equal to 3/10.    Baseline Pain levels increase to 8/10    Time 6    Period Weeks    Status New                  Plan - 10/15/19 1511    Clinical Impression Statement Patient is a 19 year old nonbinary individual who presents to physical therapy with chronic low back pain and increased lumbar lordosis and thoracic kyphosis. Patient WFL with bilateral LE MMT. DTR 2+ with exception of left  Achilles DTR, 1+. Patient slow with sit to stand transfer and performs supine to long sit transfer for bed mobility. Patient with minimal tenderness to palpation but did report some increase of tingling with deep pressure palpation to upper glute region, bilaterally. Patient and PT discussed plan of care and discussed HEP to which she reported understanding. Patient would benefit from skilled physical therapy to address deficits and goals.    Personal Factors and Comorbidities Comorbidity 3+    Comorbidities ADHD, Anxiety, Asthma, Bipolar Disorder 2, Depression    Examination-Activity Limitations Carry;Stand;Stairs;Squat;Bend;Lift;Locomotion Level;Transfers    Examination-Participation Restrictions Meal Prep;Laundry    Stability/Clinical Decision Making Stable/Uncomplicated    Clinical Decision Making Low    Rehab Potential Good    PT Frequency 2x / week    PT Duration 6 weeks    PT Treatment/Interventions ADLs/Self Care Home Management;Electrical Stimulation;Cryotherapy;Ultrasound;Traction;Moist Heat;Gait training;Stair training;Functional mobility training;Therapeutic activities;Therapeutic exercise;Balance training;Neuromuscular re-education;Manual techniques;Passive range of motion;Patient/family education    PT Next Visit Plan Nustep, core stabilization, LE strengthening, modalities PRN for pain relief  PT Home Exercise Plan see patient education section    Consulted and Agree with Plan of Care Patient           Patient will benefit from skilled therapeutic intervention in order to improve the following deficits and impairments:  Decreased activity tolerance, Decreased strength, Decreased range of motion, Difficulty walking, Pain, Obesity, Postural dysfunction, Abnormal gait  Visit Diagnosis: Chronic bilateral low back pain with bilateral sciatica - Plan: PT plan of care cert/re-cert  Abnormal posture - Plan: PT plan of care cert/re-cert     Problem List Patient Active Problem  List   Diagnosis Date Noted  . Axillary hidradenitis suppurativa 09/25/2019  . Cellulitis of external ear, left 09/25/2019  . Hypersomnolence 09/03/2019  . Mild intermittent asthma without complication 02/14/2017  . Bipolar depression (HCC) 02/14/2017  . Severe major depression (HCC) 03/10/2016    Guss Bunde, PT, DPT 10/15/2019, 6:32 PM  [facility] [address] [place], [place], [address] Phone: [phone]   Fax:  [phone]  Name: Frances Clark MRN: [id number] Date of Birth: 20-Aug-2000

## 2019-10-25 ENCOUNTER — Encounter: Payer: Self-pay | Admitting: Nurse Practitioner

## 2019-10-25 ENCOUNTER — Ambulatory Visit (INDEPENDENT_AMBULATORY_CARE_PROVIDER_SITE_OTHER): Payer: 59 | Admitting: Nurse Practitioner

## 2019-10-25 ENCOUNTER — Other Ambulatory Visit: Payer: Self-pay

## 2019-10-25 ENCOUNTER — Ambulatory Visit: Payer: 59 | Admitting: Nurse Practitioner

## 2019-10-25 VITALS — BP 152/87 | HR 103 | Temp 97.9°F | Ht 64.0 in | Wt 298.2 lb

## 2019-10-25 DIAGNOSIS — L02419 Cutaneous abscess of limb, unspecified: Secondary | ICD-10-CM

## 2019-10-25 DIAGNOSIS — Z9109 Other allergy status, other than to drugs and biological substances: Secondary | ICD-10-CM | POA: Diagnosis not present

## 2019-10-25 MED ORDER — ANTI-ITCH 1-0.1 % EX CREA: TOPICAL_CREAM | Freq: Three times a day (TID) | CUTANEOUS | 0 refills | Status: DC | PRN

## 2019-10-25 NOTE — Assessment & Plan Note (Signed)
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  Advised patient to discontinue wearing jewelry with nickel.  Earlobe is erythematous, peeling and itchy.  Patient denies pain or fever started patient on antihistamine topical cream.  Rx sent to pharmacy Follow-up with worsening or unresolved symptoms.

## 2019-10-25 NOTE — Patient Instructions (Addendum)
Armpit abscess Patient is a 19 year old who presents with abscess in her armpit this is not new for patient and recurrent.  Patient was at the urgent care 10/14/2019 for the same problem and was given clindamycin 300 mg 3 times daily for 10 days.  Patient is here today to discuss how to prevent further breakout.  Provided education on personal hygiene, weight loss, in the future removal of sweat glands may help.  Advised patient to stop using blade on  skin and change to natural products.  Washing with antibacterial soap.  Printed handout given to patient Follow-up with worsening or unresolved symptoms.  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  Advised patient to discontinue wearing jewelry with nickel.  Earlobe is erythematous, peeling and itchy.  Patient denies pain or fever started patient on antihistamine topical cream.  Rx sent to pharmacy Follow-up with worsening or unresolved symptoms.   Skin Abscess  A skin abscess is an infected area of your skin that contains pus and other material. An abscess can happen in any part of your body. Some abscesses break open (rupture) on their own. Most continue to get worse unless they are treated. The infection can spread deeper into the body and into your blood, which can make you feel sick. A skin abscess is caused by germs that enter the skin through a cut or scrape. It can also be caused by blocked oil and sweat glands or infected hair follicles. This condition is usually treated by:  Draining the pus.  Taking antibiotic medicines.  Placing a warm, wet washcloth over the abscess. Follow these instructions at home: Medicines   Take over-the-counter and prescription medicines only as told by your doctor.  If you were prescribed an antibiotic medicine, take it as told by your doctor. Do not stop taking the antibiotic even if you start to feel better. Abscess care   If you have an  abscess that has not drained, place a warm, clean, wet washcloth over the abscess several times a day. Do this as told by your doctor.  Follow instructions from your doctor about how to take care of your abscess. Make sure you: ? Cover the abscess with a bandage (dressing). ? Change your bandage or gauze as told by your doctor. ? Wash your hands with soap and water before you change the bandage or gauze. If you cannot use soap and water, use hand sanitizer.  Check your abscess every day for signs that the infection is getting worse. Check for: ? More redness, swelling, or pain. ? More fluid or blood. ? Warmth. ? More pus or a bad smell. General instructions  To avoid spreading the infection: ? Do not share personal care items, towels, or hot tubs with others. ? Avoid making skin-to-skin contact with other people.  Keep all follow-up visits as told by your doctor. This is important. Contact a doctor if:  You have more redness, swelling, or pain around your abscess.  You have more fluid or blood coming from your abscess.  Your abscess feels warm when you touch it.  You have more pus or a bad smell coming from your abscess.  You have a fever.  Your muscles ache.  You have chills.  You feel sick. Get help right away if:  You have very bad (severe) pain.  You see red streaks on your skin spreading away from the abscess. Summary  A skin abscess is an infected area  of your skin that contains pus and other material.  The abscess is caused by germs that enter the skin through a cut or scrape. It can also be caused by blocked oil and sweat glands or infected hair follicles.  Follow your doctor's instructions on caring for your abscess, taking medicines, preventing infections, and keeping follow-up visits. This information is not intended to replace advice given to you by your health care provider. Make sure you discuss any questions you have with your health care  provider. Document Revised: 10/05/2018 Document Reviewed: 04/14/2017 Elsevier Patient Education  2020 [employer].

## 2019-10-25 NOTE — Assessment & Plan Note (Signed)
Patient is a 19 year old who presents with abscess in her armpit this is not new for patient and recurrent.  Patient was at the urgent care 10/14/2019 for the same problem and was given clindamycin 300 mg 3 times daily for 10 days.  Patient is here today to discuss how to prevent further breakout.  Provided education on personal hygiene, weight loss, in the future removal of sweat glands may help.  Advised patient to stop using blade on  skin and change to natural products.  Washing with antibacterial soap.  Printed handout given to patient Follow-up with worsening or unresolved symptoms.

## 2019-10-25 NOTE — Progress Notes (Signed)
Established Patient Office Visit  Subjective:  Patient ID: Frances Clark, adult    DOB: 2001-02-16  Age: 19 y.o. MRN: [id number]  CC: No chief complaint on file.   HPI Frances Clark presents for abscess in left arm..  This is not new for patient but recurrent.  Patient report going to urgent care 10/14/2019 for the same problem in started clindamycin 300 mg 3 times daily for 10 days.  Patient is here today to discuss how to further prevent breakout.  Patient is asking that abscess be closed with stitches.  Patient denies fever,and any signs /symptoms of infection.    Nickel allergy Concerning patient's nickel allergy she is reporting burning, itching, red and peeling right earlobe from jewelry stained with nickel.  This occurred in the last 5 to 7 days.  Symptoms are gradually resolving.  Patient denies feveror pain.    Past Medical History:  Diagnosis Date   ADHD (attention deficit hyperactivity disorder)    Anxiety    Asthma    Bipolar 2 disorder (HCC)    Depression    Polycystic ovarian syndrome     Past Surgical History:  Procedure Laterality Date   CHOLECYSTECTOMY N/A 10/20/2018   Procedure: LAPAROSCOPIC CHOLECYSTECTOMY;  Surgeon: Axel Filler, MD;  Location: [facility] OR;  Service: General;  Laterality: N/A;   ENDOSCOPIC RETROGRADE CHOLANGIOPANCREATOGRAPHY (ERCP) WITH PROPOFOL N/A 10/21/2018   Procedure: ENDOSCOPIC RETROGRADE CHOLANGIOPANCREATOGRAPHY (ERCP) WITH PROPOFOL;  Surgeon: Vida Rigger, MD;  Location: [facility] ENDOSCOPY;  Service: Endoscopy;  Laterality: N/A;   REMOVAL OF STONES  10/21/2018   Procedure: REMOVAL OF STONES;  Surgeon: Vida Rigger, MD;  Location: [facility] ENDOSCOPY;  Service: Endoscopy;;   SPHINCTEROTOMY  10/21/2018   Procedure: Dennison Mascot;  Surgeon: Vida Rigger, MD;  Location: [facility] ENDOSCOPY;  Service: Endoscopy;;   WISDOM TOOTH EXTRACTION      Family History  Problem Relation Age of Onset   Anxiety disorder Mother    Drug abuse Mother    Anxiety  disorder Maternal Aunt    Anxiety disorder Maternal Grandmother    Alcohol abuse Maternal Grandmother    Alcohol abuse Paternal Grandmother    Depression Paternal Grandmother    Anxiety disorder Paternal Grandmother    Drug abuse Maternal Aunt    Asthma Sister    Diabetes Maternal Grandfather    Heart disease Paternal Grandfather     Social History   Socioeconomic History   Marital status: Single    Spouse name: Not on file   Number of children: Not on file   Years of education: Not on file   Highest education level: Not on file  Occupational History   Not on file  Tobacco Use   Smoking status: Passive Smoke Exposure - Never Smoker   Smokeless tobacco: Never Used  Vaping Use   Vaping Use: Never used  Substance and Sexual Activity   Alcohol use: Yes    Comment: twice a month   Drug use: No   Sexual activity: Never    Birth control/protection: None  Other Topics Concern   Not on file  Social History Narrative   Not on file   Social Determinants of Health   Financial Resource Strain: Low Risk    Difficulty of Paying Living Expenses: Not very hard  Food Insecurity: No Food Insecurity   Worried About Running Out of Food in the Last Year: Never true   Ran Out of Food in the Last Year: Never true  Transportation Needs: No Transportation Needs  Lack of Transportation (Medical): No   Lack of Transportation (Non-Medical): No  Physical Activity: Insufficiently Active   Days of Exercise per Week: 1 day   Minutes of Exercise per Session: 30 min  Stress: Stress Concern Present   Feeling of Stress : Rather much  Social Connections: Socially Isolated   Frequency of Communication with Friends and Family: Once a week   Frequency of Social Gatherings with Friends and Family: Once a week   Attends Religious Services: Never   Database administrator or Organizations: No   Attends Engineer, structural: Never   Marital Status: Never  married  Catering manager Violence: Not At Risk   Fear of Current or Ex-Partner: No   Emotionally Abused: No   Physically Abused: No   Sexually Abused: No    Outpatient Medications Prior to Visit  Medication Sig Dispense Refill   acetaminophen (TYLENOL) 500 MG tablet Take 1,000 mg by mouth every 6 (six) hours as needed for mild pain.     albuterol (PROVENTIL) (5 MG/ML) 0.5% nebulizer solution Inhale 0.5 mLs into the lungs every 4 (four) hours as needed for wheezing.     albuterol (VENTOLIN HFA) 108 (90 Base) MCG/ACT inhaler Inhale 2 puffs into the lungs every 6 (six) hours as needed for wheezing or shortness of breath. 18 g 5   amphetamine-dextroamphetamine (ADDERALL XR) 5 MG 24 hr capsule Take 1 capsule by mouth daily.     cephALEXin (KEFLEX) 500 MG capsule Take 1 capsule (500 mg total) by mouth 2 (two) times daily. 20 capsule 0   clonazePAM (KLONOPIN) 0.5 MG tablet Take 0.5 mg by mouth daily as needed.     diclofenac (VOLTAREN) 75 MG EC tablet Take 1 tablet (75 mg total) by mouth 2 (two) times daily. 60 tablet 2   DULoxetine (CYMBALTA) 60 MG capsule Take 1 capsule (60 mg total) by mouth daily. (Patient taking differently: Take 120 mg by mouth daily. ) 90 capsule 2   fluticasone (FLONASE) 50 MCG/ACT nasal spray SPRAY 2 SPRAYS INTO EACH NOSTRIL EVERY DAY 16 mL 5   ibuprofen (ADVIL,MOTRIN) 200 MG tablet Take 400 mg by mouth daily as needed for headache.     lamoTRIgine (LAMICTAL) 25 MG tablet Take 1 tablet by mouth daily.     medroxyPROGESTERone (PROVERA) 10 MG tablet Take 1 tablet (10 mg total) by mouth daily. X 10days every 3 months 10 tablet 3   mometasone (ELOCON) 0.1 % cream Apply 1 application topically daily. 45 g 0   silver sulfADIAZINE (SILVADENE) 1 % cream Apply to affected areas 1-2 times daily 50 g 3   No facility-administered medications prior to visit.    Allergies  Allergen Reactions   Other     CATS    ROS Review of Systems  Constitutional:  Negative.   HENT: Negative.   Eyes: Negative.   Gastrointestinal: Negative.   Genitourinary: Negative.   Musculoskeletal: Negative.   Skin: Positive for color change and rash.  Neurological: Negative.   Psychiatric/Behavioral: The patient is nervous/anxious.       Objective:    Physical Exam Vitals reviewed.  Constitutional:      Appearance: Normal appearance.  HENT:     Head: Normocephalic.  Eyes:     Conjunctiva/sclera: Conjunctivae normal.  Cardiovascular:     Rate and Rhythm: Normal rate.     Pulses: Normal pulses.     Heart sounds: Normal heart sounds.  Pulmonary:     Effort: Pulmonary effort is  normal.     Breath sounds: Normal breath sounds.  Abdominal:     General: Bowel sounds are normal.  Musculoskeletal:        General: Normal range of motion.  Skin:    Findings: Erythema and rash present.     Comments: Right arm pit abscess  Neurological:     Mental Status: Frances Clark is alert and oriented to person, place, and time.  Psychiatric:     Comments: Increase anxiety      BP (!) 152/87    Pulse (!) 103    Temp 97.9 F (36.6 C)    Ht 5\' 4"  (1.626 m)    Wt 298 lb 3.2 oz (135.3 kg)    SpO2 97%    BMI 51.19 kg/m  Wt Readings from Last 3 Encounters:  10/25/19 298 lb 3.2 oz (135.3 kg) (>99 %, Z= 2.85)*  09/25/19 297 lb 12.8 oz (135.1 kg) (>99 %, Z= 2.85)*  09/18/19 300 lb (136.1 kg) (>99 %, Z= 2.86)*   * Growth percentiles are based on CDC (Girls, 2-20 Years) data.     Health Maintenance Due  Topic Date Due   Hepatitis C Screening  Never done   COVID-19 Vaccine (2 - Moderna 2-dose series) 07/16/2019   INFLUENZA VACCINE  10/14/2019     Office Visit from 10/25/2019 in [facility]  PHQ-9 Total Score 11      Lab Results  Component Value Date   TSH 3.760 06/18/2019   Lab Results  Component Value Date   WBC 9.0 10/22/2018   HGB 10.7 (L) 10/22/2018   HCT 34.6 (L) 10/22/2018   MCV 88.3 10/22/2018   PLT 330 10/22/2018     Lab Results  Component Value Date   NA 140 10/22/2018   K 3.7 10/22/2018   CO2 23 10/22/2018   GLUCOSE 114 (H) 10/22/2018   BUN 6 10/22/2018   CREATININE 0.69 10/22/2018   BILITOT 0.6 10/22/2018   ALKPHOS 119 10/22/2018   AST 343 (H) 10/22/2018   ALT 611 (H) 10/22/2018   PROT 6.0 (L) 10/22/2018   ALBUMIN 3.2 (L) 10/22/2018   CALCIUM 8.7 (L) 10/22/2018   ANIONGAP 11 10/22/2018   Lab Results  Component Value Date   CHOL 135 03/11/2016   Lab Results  Component Value Date   HDL 47 03/11/2016   Lab Results  Component Value Date   LDLCALC 71 03/11/2016  Armpit abscess Patient is a 19 year old who presents with abscess in her armpit this is not new for patient and recurrent.  Patient was at the urgent care 10/14/2019 for the same problem and was given clindamycin 300 mg 3 times daily for 10 days.  Patient is here today to discuss how to prevent further breakout.  Provided education on personal hygiene, weight loss, in the future removal of sweat glands may help.  Advised patient to stop using blade on  skin and change to natural products.  Washing with antibacterial soap.  Printed handout given to patient Follow-up with worsening or unresolved symptoms.  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  Advised patient to discontinue wearing jewelry with nickel.  Earlobe is erythematous, peeling and itchy.  Patient denies pain or fever started patient on antihistamine topical cream.  Rx sent to pharmacy Follow-up with worsening or unresolved symptoms.  Lab Results  Component Value Date   TRIG 85 03/11/2016   Lab Results  Component Value Date  CHOLHDL 2.9 03/11/2016   Lab Results  Component Value Date   HGBA1C 5.6 06/18/2019      Assessment & Plan:   Problem List Items Addressed This Visit      Other   Nickel allergy - Primary   Armpit abscess      Meds ordered this encounter  Medications    diphenhydrAMINE-zinc acetate (ANTI-ITCH) cream    Sig: Apply topically 3 (three) times daily as needed for itching.    Dispense:  28.3 g    Refill:  0    Order Specific Question:   Supervising Provider    Answer:   Arville CareETTINGER, JOSHUA A [[id number]]    Follow-up: Return if symptoms worsen or fail to improve.    Frances Drownnyeje M Lurae Hornbrook, NP

## 2019-10-30 ENCOUNTER — Telehealth: Payer: Self-pay | Admitting: Family Medicine

## 2019-11-01 NOTE — Telephone Encounter (Signed)
Advised patient to complete antibiotic dose given in the ED.  Imodium can be sent to pharmacy or Pepto-Bismol over-the-counter. Is patient still on antibiotic and how many does she have left.

## 2019-11-02 ENCOUNTER — Telehealth: Payer: Self-pay | Admitting: Family Medicine

## 2019-11-02 NOTE — Telephone Encounter (Signed)
Pt states she has completed the antibiotic but continues to have diarrhea, advised to drink plenty of fluids and to use pepto or Immodium otc and monitor. If it gets worse or continues to call us back. Pt voiced understanding.

## 2019-11-05 ENCOUNTER — Ambulatory Visit: Payer: 59 | Admitting: Physical Therapy

## 2019-11-20 DIAGNOSIS — L0291 Cutaneous abscess, unspecified: Secondary | ICD-10-CM | POA: Diagnosis not present

## 2019-11-20 DIAGNOSIS — L732 Hidradenitis suppurativa: Secondary | ICD-10-CM | POA: Diagnosis not present

## 2019-12-11 ENCOUNTER — Encounter: Payer: Self-pay | Admitting: Family Medicine

## 2019-12-11 ENCOUNTER — Other Ambulatory Visit: Payer: Self-pay

## 2019-12-11 ENCOUNTER — Ambulatory Visit (INDEPENDENT_AMBULATORY_CARE_PROVIDER_SITE_OTHER): Payer: 59 | Admitting: Family Medicine

## 2019-12-11 VITALS — BP 120/78 | HR 96 | Temp 97.5°F | Ht 64.0 in | Wt 296.6 lb

## 2019-12-11 DIAGNOSIS — K219 Gastro-esophageal reflux disease without esophagitis: Secondary | ICD-10-CM | POA: Diagnosis not present

## 2019-12-11 MED ORDER — OMEPRAZOLE 20 MG PO CPDR: 20.0000 mg | DELAYED_RELEASE_CAPSULE | Freq: Every day | ORAL | 3 refills | Status: DC

## 2019-12-11 NOTE — Patient Instructions (Signed)

## 2019-12-11 NOTE — Progress Notes (Signed)
Subjective: CC: reflux PCP: Frances Fudge, FNP  [id number] R Record is a 19 y.o. adult presenting to clinic today for:  1. GERD Frances Clark was diagnosed with GERD in childhood. She reports increased symptoms for the last 2 months. She has frequent nausea and heartburn. She has difficulty sleeping at night because of heartburn. They have found that sipping on milk help relieve her symptoms. They have also been trying to eat small, frequent meals.   Current medications - No, they were taking zantac but not currently since it was recalled Cough - No Sore throat - No Voice change - No Hemoptysis - No Dysphagia or dyspepsia - No Water brash - Yes Red Flags (weight loss, hematochezia, melena, weight loss, early satiety, fevers, odynophagia, or persistent vomiting) - No  Relevant past medical, surgical, family, and social history reviewed and updated as indicated.  Allergies and medications reviewed and updated.  Allergies  Allergen Reactions   Other     CATS   Past Medical History:  Diagnosis Date   ADHD (attention deficit hyperactivity disorder)    Anxiety    Asthma    Bipolar 2 disorder (HCC)    Depression    Polycystic ovarian syndrome     Current Outpatient Medications:    acetaminophen (TYLENOL) 500 MG tablet, Take 1,000 mg by mouth every 6 (six) hours as needed for mild pain., Disp: , Rfl:    albuterol (PROVENTIL) (5 MG/ML) 0.5% nebulizer solution, Inhale 0.5 mLs into the lungs every 4 (four) hours as needed for wheezing., Disp: , Rfl:    albuterol (VENTOLIN HFA) 108 (90 Base) MCG/ACT inhaler, Inhale 2 puffs into the lungs every 6 (six) hours as needed for wheezing or shortness of breath., Disp: 18 g, Rfl: 5   amphetamine-dextroamphetamine (ADDERALL XR) 5 MG 24 hr capsule, Take 1 capsule by mouth daily., Disp: , Rfl:    clonazePAM (KLONOPIN) 0.5 MG tablet, Take 0.5 mg by mouth daily as needed., Disp: , Rfl:    DULoxetine (CYMBALTA) 60 MG capsule, Take 1  capsule (60 mg total) by mouth daily. (Patient taking differently: Take 120 mg by mouth daily. ), Disp: 90 capsule, Rfl: 2   fluticasone (FLONASE) 50 MCG/ACT nasal spray, SPRAY 2 SPRAYS INTO EACH NOSTRIL EVERY DAY, Disp: 16 mL, Rfl: 5   ibuprofen (ADVIL,MOTRIN) 200 MG tablet, Take 400 mg by mouth daily as needed for headache., Disp: , Rfl:    lamoTRIgine (LAMICTAL) 200 MG tablet, Take 200 mg by mouth at bedtime., Disp: , Rfl:    medroxyPROGESTERone (PROVERA) 10 MG tablet, Take 1 tablet (10 mg total) by mouth daily. X 10days every 3 months, Disp: 10 tablet, Rfl: 3   diphenhydrAMINE-zinc acetate (ANTI-ITCH) cream, Apply topically 3 (three) times daily as needed for itching., Disp: 28.3 g, Rfl: 0   mometasone (ELOCON) 0.1 % cream, Apply 1 application topically daily., Disp: 45 g, Rfl: 0   silver sulfADIAZINE (SILVADENE) 1 % cream, Apply to affected areas 1-2 times daily, Disp: 50 g, Rfl: 3 Social History   Socioeconomic History   Marital status: Single    Spouse name: Not on file   Number of children: Not on file   Years of education: Not on file   Highest education level: Not on file  Occupational History   Not on file  Tobacco Use   Smoking status: Never Smoker   Smokeless tobacco: Never Used  Vaping Use   Vaping Use: Never used  Substance and Sexual Activity   Alcohol  use: Yes    Comment: twice a month   Drug use: No   Sexual activity: Never    Birth control/protection: None  Other Topics Concern   Not on file  Social History Narrative   Not on file   Social Determinants of Health   Financial Resource Strain: Low Risk    Difficulty of Paying Living Expenses: Not very hard  Food Insecurity: No Food Insecurity   Worried About Programme researcher, broadcasting/film/video in the Last Year: Never true   Ran Out of Food in the Last Year: Never true  Transportation Needs: No Transportation Needs   Lack of Transportation (Medical): No   Lack of Transportation (Non-Medical): No    Physical Activity: Insufficiently Active   Days of Exercise per Week: 1 day   Minutes of Exercise per Session: 30 min  Stress: Stress Concern Present   Feeling of Stress : Rather much  Social Connections: Socially Isolated   Frequency of Communication with Friends and Family: Once a week   Frequency of Social Gatherings with Friends and Family: Once a week   Attends Religious Services: Never   Database administrator or Organizations: No   Attends Engineer, structural: Never   Marital Status: Never married  Catering manager Violence: Not At Risk   Fear of Current or Ex-Partner: No   Emotionally Abused: No   Physically Abused: No   Sexually Abused: No   Family History  Problem Relation Age of Onset   Anxiety disorder Mother    Drug abuse Mother    Anxiety disorder Maternal Aunt    Anxiety disorder Maternal Grandmother    Alcohol abuse Maternal Grandmother    Alcohol abuse Paternal Grandmother    Depression Paternal Grandmother    Anxiety disorder Paternal Grandmother    Drug abuse Maternal Aunt    Asthma Sister    Diabetes Maternal Grandfather    Heart disease Paternal Grandfather     Review of Systems  See HPI.  Objective: Office vital signs reviewed. BP 120/78    Pulse 96    Temp (!) 97.5 F (36.4 C) (Temporal)    Ht 5\' 4"  (1.626 m)    Wt 296 lb 9.6 oz (134.5 kg)    BMI 50.91 kg/m   Physical Examination:  Physical Exam Vitals and nursing note reviewed.  Constitutional:      General: Frances Clark is not in acute distress.    Appearance: Frances Clark is not ill-appearing or diaphoretic.  Cardiovascular:     Rate and Rhythm: Normal rate and regular rhythm.     Heart sounds: Normal heart sounds. No murmur heard.   Pulmonary:     Effort: Pulmonary effort is normal.     Breath sounds: Normal breath sounds.  Abdominal:     General: Bowel sounds are normal. There is no distension.     Palpations: Abdomen is soft. There is  no mass.     Tenderness: There is no abdominal tenderness. There is no guarding or rebound.     Hernia: No hernia is present.  Musculoskeletal:     Cervical back: Neck supple. No tenderness.  Lymphadenopathy:     Cervical: No cervical adenopathy.  Skin:    General: Skin is warm and dry.  Neurological:     General: No focal deficit present.     Mental Status: FATOU DUNNIGAN is alert and oriented to person, place, and time.  Psychiatric:  Mood and Affect: Mood normal.        Behavior: Behavior normal.     Results for orders placed or performed in visit on 06/18/19  Hemoglobin A1c  Result Value Ref Range   Hgb A1c MFr Bld 5.6 4.8 - 5.6 %   Est. average glucose Bld gHb Est-mCnc 114 mg/dL  TSH  Result Value Ref Range   TSH 3.760 0.450 - 4.500 uIU/mL     Assessment/ Plan: Kaytelyn was seen today for gastroesophageal reflux.  Diagnoses and all orders for this visit:  Gastroesophageal reflux disease without esophagitis No alarm signs today. Discussed diet and lifestyle changes, handout given. Start Prilosec daily. Follow up in 1 month to reassess, sooner if symptoms worsen.  -     omeprazole (PRILOSEC) 20 MG capsule; Take 1 capsule (20 mg total) by mouth daily.  The above assessment and management plan was discussed with the patient. The patient verbalized understanding of and has agreed to the management plan. Patient is aware to call the clinic if symptoms persist or worsen. Patient is aware when to return to the clinic for a follow-up visit. Patient educated on when it is appropriate to go to the emergency department.   Harlow Mares, FNP-C [facility] [address] [place], [place] [address] [phone]

## 2019-12-20 ENCOUNTER — Ambulatory Visit (INDEPENDENT_AMBULATORY_CARE_PROVIDER_SITE_OTHER): Payer: 59 | Admitting: Family Medicine

## 2019-12-20 ENCOUNTER — Encounter: Payer: Self-pay | Admitting: Family Medicine

## 2019-12-20 ENCOUNTER — Telehealth: Payer: Self-pay

## 2019-12-20 ENCOUNTER — Other Ambulatory Visit: Payer: Self-pay

## 2019-12-20 ENCOUNTER — Ambulatory Visit (INDEPENDENT_AMBULATORY_CARE_PROVIDER_SITE_OTHER): Payer: 59

## 2019-12-20 VITALS — BP 133/79 | HR 102 | Temp 97.5°F | Ht 64.0 in | Wt 291.8 lb

## 2019-12-20 DIAGNOSIS — M5430 Sciatica, unspecified side: Secondary | ICD-10-CM

## 2019-12-20 DIAGNOSIS — N62 Hypertrophy of breast: Secondary | ICD-10-CM

## 2019-12-20 DIAGNOSIS — M79672 Pain in left foot: Secondary | ICD-10-CM

## 2019-12-20 DIAGNOSIS — M549 Dorsalgia, unspecified: Secondary | ICD-10-CM | POA: Diagnosis not present

## 2019-12-20 NOTE — Progress Notes (Signed)
Assessment & Plan:  1. Left foot pain - Recommended NSAID for pain when it occurs.  - DG Foot Complete Left  2. Back pain with sciatica - Discussed insurance is not going to pay for a MRI unless she fails 6 weeks of PT. Referring back. - Ambulatory referral to Physical Therapy - Ambulatory referral to Plastic Surgery  3. Large breasts - Ambulatory referral to Plastic Surgery   Follow up plan: Return if symptoms worsen or fail to improve.  Deliah Boston, MSN, APRN, FNP-C [facility]  Subjective:   Patient ID: Frances Clark, adult    DOB: [birth year], 19 y.o.   MRN: [id number]  HPI: Frances Clark is a 19 y.o. adult presenting on 12/20/2019 for Foot Pain (Patient states that she has been having on and off sharpe pain on the top of her left foot when she walks.)  Patient reports pain in the top of her left foot that occurs every few months since she was a preteen. She describes the pain as sharp and always occurring in the same location, which is where her brother fell on it when she was younger. She rates the pain 5-6/10 when it occurs. The pain started this go round 4 days ago, but is feeling better today. She does not take any medication when the pain occurs. No previous imaging.   Patient went to PT twice before she quit going. She did feel it was somewhat helpful when she went. She is requesting a MRI of her back today per her mom's request as she states her mother had bulging discs at her age.   Patient is also requesting a referral for a breast reduction.    ROS: Negative unless specifically indicated above in HPI.   Relevant past medical history reviewed and updated as indicated.   Allergies and medications reviewed and updated.   Current Outpatient Medications:    acetaminophen (TYLENOL) 500 MG tablet, Take 1,000 mg by mouth every 6 (six) hours as needed for mild pain., Disp: , Rfl:    albuterol (PROVENTIL) (5 MG/ML) 0.5% nebulizer  solution, Inhale 0.5 mLs into the lungs every 4 (four) hours as needed for wheezing., Disp: , Rfl:    albuterol (VENTOLIN HFA) 108 (90 Base) MCG/ACT inhaler, Inhale 2 puffs into the lungs every 6 (six) hours as needed for wheezing or shortness of breath., Disp: 18 g, Rfl: 5   amphetamine-dextroamphetamine (ADDERALL XR) 5 MG 24 hr capsule, Take 1 capsule by mouth daily., Disp: , Rfl:    clonazePAM (KLONOPIN) 0.5 MG tablet, Take 0.5 mg by mouth daily as needed., Disp: , Rfl:    DULoxetine (CYMBALTA) 60 MG capsule, Take 1 capsule (60 mg total) by mouth daily. (Patient taking differently: Take 120 mg by mouth daily. ), Disp: 90 capsule, Rfl: 2   fluticasone (FLONASE) 50 MCG/ACT nasal spray, SPRAY 2 SPRAYS INTO EACH NOSTRIL EVERY DAY, Disp: 16 mL, Rfl: 5   ibuprofen (ADVIL,MOTRIN) 200 MG tablet, Take 400 mg by mouth daily as needed for headache., Disp: , Rfl:    lamoTRIgine (LAMICTAL) 200 MG tablet, Take 200 mg by mouth at bedtime., Disp: , Rfl:    medroxyPROGESTERone (PROVERA) 10 MG tablet, Take 1 tablet (10 mg total) by mouth daily. X 10days every 3 months, Disp: 10 tablet, Rfl: 3   omeprazole (PRILOSEC) 20 MG capsule, Take 1 capsule (20 mg total) by mouth daily., Disp: 90 capsule, Rfl: 3  Allergies  Allergen Reactions   Other  CATS    Objective:   BP 133/79    Pulse (!) 102    Temp (!) 97.5 F (36.4 C) (Temporal)    Ht 5\' 4"  (1.626 m)    Wt 291 lb 12.8 oz (132.4 kg)    SpO2 93%    BMI 50.09 kg/m    Physical Exam Vitals reviewed.  Constitutional:      General: Frances Clark is not in acute distress.    Appearance: Normal appearance. Frances Clark is not ill-appearing, toxic-appearing or diaphoretic.  HENT:     Head: Normocephalic and atraumatic.  Eyes:     General: No scleral icterus.       Right eye: No discharge.        Left eye: No discharge.     Conjunctiva/sclera: Conjunctivae normal.  Cardiovascular:     Rate and Rhythm: Normal rate.  Pulmonary:     Effort:  Pulmonary effort is normal. No respiratory distress.  Musculoskeletal:        General: Normal range of motion.     Cervical back: Normal range of motion.  Feet:     Left foot:     Skin integrity: Skin integrity normal.     Comments: No abnormality. No pain today. Skin:    General: Skin is warm and dry.  Neurological:     Mental Status: Frances Clark is alert and oriented to person, place, and time. Mental status is at baseline.  Psychiatric:        Mood and Affect: Mood normal.        Behavior: Behavior normal.        Thought Content: Thought content normal.        Judgment: Judgment normal.

## 2019-12-21 NOTE — Telephone Encounter (Signed)
I will take care of this

## 2019-12-23 ENCOUNTER — Encounter: Payer: Self-pay | Admitting: Family Medicine

## 2019-12-31 ENCOUNTER — Other Ambulatory Visit: Payer: Self-pay

## 2019-12-31 ENCOUNTER — Ambulatory Visit: Payer: 59 | Attending: Family Medicine | Admitting: Physical Therapy

## 2019-12-31 ENCOUNTER — Encounter: Payer: Self-pay | Admitting: Physical Therapy

## 2019-12-31 DIAGNOSIS — M5441 Lumbago with sciatica, right side: Secondary | ICD-10-CM | POA: Insufficient documentation

## 2019-12-31 DIAGNOSIS — M5442 Lumbago with sciatica, left side: Secondary | ICD-10-CM | POA: Diagnosis not present

## 2019-12-31 DIAGNOSIS — G8929 Other chronic pain: Secondary | ICD-10-CM | POA: Diagnosis not present

## 2019-12-31 DIAGNOSIS — R293 Abnormal posture: Secondary | ICD-10-CM | POA: Diagnosis not present

## 2019-12-31 NOTE — Therapy (Signed)
[facility] [address] [place], [place], [address] Phone: [phone]   Fax:  [phone]  Physical Therapy Evaluation  Patient Details  Name: Frances Clark MRN: [id number] Date of Birth: 06/10/2000 Referring Provider (PT): Deliah BostonBritney Joyce, FNP   Encounter Date: 12/31/2019   PT End of Session - 12/31/19 1336    Visit Number 1    Number of Visits 12    Date for PT Re-Evaluation 02/18/20    Authorization Type Medicaid [employer]    PT Start Time 1300    PT Stop Time 1333    PT Time Calculation (min) 33 min    Activity Tolerance Patient tolerated treatment well    Behavior During Therapy WFL for tasks assessed/performed           Past Medical History:  Diagnosis Date   ADHD (attention deficit hyperactivity disorder)    Anxiety    Asthma    Bipolar 2 disorder (HCC)    Depression    Polycystic ovarian syndrome     Past Surgical History:  Procedure Laterality Date   CHOLECYSTECTOMY N/A 10/20/2018   Procedure: LAPAROSCOPIC CHOLECYSTECTOMY;  Surgeon: Axel Filleramirez, Armando, MD;  [facility];  Service: General;  Laterality: N/A;   ENDOSCOPIC RETROGRADE CHOLANGIOPANCREATOGRAPHY (ERCP) WITH PROPOFOL N/A 10/21/2018   Procedure: ENDOSCOPIC RETROGRADE CHOLANGIOPANCREATOGRAPHY (ERCP) WITH PROPOFOL;  Surgeon: Vida RiggerMagod, Marc, MD;  [facility];  Service: Endoscopy;  Laterality: N/A;   REMOVAL OF STONES  10/21/2018   Procedure: REMOVAL OF STONES;  Surgeon: Vida RiggerMagod, Marc, MD;  [facility];  Service: Endoscopy;;   SPHINCTEROTOMY  10/21/2018   Procedure: Dennison MascotSPHINCTEROTOMY;  Surgeon: Vida RiggerMagod, Marc, MD;  [facility];  Service: Endoscopy;;   WISDOM TOOTH EXTRACTION      There were no vitals filed for this visit.    Subjective Assessment - 12/31/19 1917    Subjective COVID-19 screening performed upon arrival. Patient arrives to physical therapy with reports of back pain that radiates ot bilateral thighs. Patient  reports pain with ADLs, home activities, and walking. Patient reports needing multiple rest breaks to complete any activities. Patient's pain at worst is rated at 7/10 and pain at best is 0/10 with rest. Patient's goals are to decrease pain, improve movement, improve standing and walking tolerance, and improve ability to perform home and work activities.    Pertinent History ADHD, Anxiety, Asthma, Bipolar Disorder 2, Depression    Limitations Sitting;Lifting;Walking;Standing;House hold activities    How long can you sit comfortably? "few hours"    How long can you stand comfortably? "5-10 minutes"    How long can you walk comfortably? "<10 minutes"    Diagnostic tests x-ray: normal results    Patient Stated Goals return to PLOF    Currently in Pain? Yes    Pain Score 2     Pain Location Back    Pain Orientation Lower;Mid    Pain Descriptors / Indicators Aching;Shooting;Dull    Pain Type Chronic pain    Pain Radiating Towards bilateral lateral knees    Pain Onset More than a month ago    Pain Frequency Constant    Aggravating Factors  "walking bending, lifting, standing"    Pain Relieving Factors "laying, sitting, resting; tylenol, ibuprofen as needed"    Effect of Pain on Daily Activities "limited walking and mobility"              [facility] - 12/31/19 0001      Assessment   Medical Diagnosis  Back pain with sciatica    Referring Provider (PT) Deliah Boston, FNP    Onset Date/Surgical Date --   ongoing   Next MD Visit "unsure"    Prior Therapy IE & HEP only       Precautions   Precautions None      Home Environment   Living Environment Private residence    Living Arrangements Non-relatives/Friends    Type of Home Apartment    Home Access Stairs to enter      Prior Function   Level of Independence Independent with basic ADLs;Needs assistance with homemaking      Posture/Postural Control   Posture/Postural Control Postural limitations    Postural Limitations  Rounded Shoulders;Forward head;Increased lumbar lordosis;Increased thoracic kyphosis;Flexed trunk      Deep Tendon Reflexes   DTR Assessment Site Patella    Patella DTR 2+      ROM / Strength   AROM / PROM / Strength AROM;Strength      AROM   Overall AROM  Deficits;Due to pain    AROM Assessment Site Lumbar    Lumbar Flexion finger tips to floor    Lumbar - Right Side Bend 16" finger tip to floor    Lumbar - Left Side Bend 15" finger tip to floor      Strength   Overall Strength Within functional limits for tasks performed    Strength Assessment Site Hip;Knee    Right/Left Hip Right;Left    Right Hip Flexion 4+/5    Right Hip Extension 3+/5    Right Hip ABduction 4-/5    Left Hip Flexion 4+/5    Left Hip Extension 3+/5    Left Hip ABduction 4-/5    Right Knee Flexion 4+/5    Right Knee Extension 4+/5    Left Knee Flexion 4+/5    Left Knee Extension 4+/5      Palpation   Palpation comment no reports of tenderness to palpation of lumbar musculature or glutes      Transfers   Five time sit to stand comments  14.53 seconds      Ambulation/Gait   Gait Pattern Step-through pattern;Wide base of support;Decreased stride length                      Objective measurements completed on examination: See above findings.               PT Education - 12/31/19 1941    Education Details draw ins, pelvic tilt, figure 4 stretch, bridging,  sit to stand    Person(s) Educated Patient    Methods Demonstration;Handout;Explanation    Comprehension Verbalized understanding;Returned demonstration            PT Short Term Goals - 12/31/19 1952      PT SHORT TERM GOAL #1   Title Patient will be independent with HEP    Baseline no knowledge of HEP    Time 3    Period Weeks    Status New      PT SHORT TERM GOAL #2   Title Patient will demonstrate proper log rolling bed mobility mechanics to protects spine during bed transitions.    Baseline supine to long  sit transfer.    Time 3    Period Weeks    Status New             PT Long Term Goals - 12/31/19 1954      PT LONG TERM GOAL #1  Title Patient will be independent with advanced HEP    Baseline no knowledge of HEP    Time 6    Period Weeks    Status New      PT LONG TERM GOAL #2   Title Patient will report ability to walk around community for 20 mins or greater with low back pain less than or equal to 3/10    Baseline 5-10 minute average pain can increase to 7/10    Time 6    Period Weeks    Status New      PT LONG TERM GOAL #3   Title Patient will report a centralization of neurological symptoms to low back or report no neurological symptoms to indicate no nerve irritation.    Baseline bilateral neurological symptoms to lateral knee    Time 6    Period Weeks    Status New      PT LONG TERM GOAL #4   Title Patient will report ability to perform ADLs with low back pain less than or equal to 3/10.    Baseline Pain levels increase to 7/10    Time 6    Period Weeks    Status New      PT LONG TERM GOAL #5   Title Patient will perform 5x sit to stand test in 11 seconds or greater to improve functional LE strength.    Baseline 14.53 seconds    Time 6    Period Weeks    Status New                  Plan - 12/31/19 1943    Clinical Impression Statement Patient is a 19 year old nonbinary individual who presents to physical therapy with chronic low back pain and abnormal posture. Patient demonstated [facility] LE MMT. Bilateral patella DTR were normal. No tenderness to palpation reported throughout lumbar spine and glutes. Patient and PT discussed POC and HEP to which patient reported understanding. Patient would benefit from skilled physical therapy to address deficits and goals.    Personal Factors and Comorbidities Comorbidity 3+    Comorbidities ADHD, Anxiety, Asthma, Bipolar Disorder 2, Depression    Examination-Activity Limitations  Carry;Stand;Stairs;Squat;Bend;Lift;Locomotion Level;Transfers    Examination-Participation Restrictions Meal Prep;Laundry    Stability/Clinical Decision Making Stable/Uncomplicated    Clinical Decision Making Low    Rehab Potential Good    PT Frequency 2x / week    PT Duration 6 weeks    PT Treatment/Interventions ADLs/Self Care Home Management;Ultrasound;Traction;Moist Heat;Therapeutic activities;Therapeutic exercise;Balance training;Neuromuscular re-education;Manual techniques;Passive range of motion;Patient/family education    PT Next Visit Plan Nustep, core stabilization, LE strengthening, modalities PRN for pain relief    PT Home Exercise Plan see patient education section    Consulted and Agree with Plan of Care Patient           Patient will benefit from skilled therapeutic intervention in order to improve the following deficits and impairments:  Decreased activity tolerance, Decreased strength, Decreased range of motion, Difficulty walking, Pain, Obesity, Postural dysfunction, Abnormal gait  Visit Diagnosis: Chronic bilateral low back pain with bilateral sciatica - Plan: PT plan of care cert/re-cert  Abnormal posture - Plan: PT plan of care cert/re-cert     Problem List Patient Active Problem List   Diagnosis Date Noted   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

## 2020-01-10 ENCOUNTER — Ambulatory Visit: Payer: 59 | Admitting: Family Medicine

## 2020-01-11 ENCOUNTER — Ambulatory Visit: Payer: 59 | Admitting: Nurse Practitioner

## 2020-01-22 DIAGNOSIS — J020 Streptococcal pharyngitis: Secondary | ICD-10-CM | POA: Diagnosis not present

## 2020-01-22 DIAGNOSIS — J029 Acute pharyngitis, unspecified: Secondary | ICD-10-CM | POA: Diagnosis not present

## 2020-01-24 ENCOUNTER — Ambulatory Visit: Payer: 59 | Admitting: Nurse Practitioner

## 2020-01-24 ENCOUNTER — Ambulatory Visit: Payer: 59 | Admitting: *Deleted

## 2020-01-25 DIAGNOSIS — J020 Streptococcal pharyngitis: Secondary | ICD-10-CM | POA: Diagnosis not present

## 2020-02-01 ENCOUNTER — Ambulatory Visit: Payer: 59 | Admitting: Nurse Practitioner

## 2020-02-14 ENCOUNTER — Institutional Professional Consult (permissible substitution): Payer: 59 | Admitting: Plastic Surgery

## 2020-04-17 ENCOUNTER — Other Ambulatory Visit: Payer: Self-pay

## 2020-04-17 ENCOUNTER — Ambulatory Visit (INDEPENDENT_AMBULATORY_CARE_PROVIDER_SITE_OTHER): Payer: 59 | Admitting: Family Medicine

## 2020-04-17 ENCOUNTER — Encounter: Payer: Self-pay | Admitting: Family Medicine

## 2020-04-17 VITALS — BP 117/74 | HR 84 | Temp 97.6°F | Ht 64.0 in | Wt 287.0 lb

## 2020-04-17 DIAGNOSIS — G5603 Carpal tunnel syndrome, bilateral upper limbs: Secondary | ICD-10-CM

## 2020-04-17 MED ORDER — NAPROXEN 500 MG PO TABS: 500.0000 mg | ORAL_TABLET | Freq: Two times a day (BID) | ORAL | 0 refills | Status: DC

## 2020-04-17 MED ORDER — PREDNISONE 20 MG PO TABS: ORAL_TABLET | ORAL | 0 refills | Status: DC

## 2020-04-17 NOTE — Patient Instructions (Signed)

## 2020-04-17 NOTE — Progress Notes (Signed)
Established Patient Office Visit  Subjective:  Patient ID: Frances Clark, adult    DOB: [birth year]  Age: 20 y.o. MRN: [id number]  CC:  Chief Complaint  Patient presents with  . Hand Pain    HPI Frances Clark presents for hand pain in both pain. They were diagnosed with carpal tunnel in their right hand about 2 years ago. They are having worsening symptoms in their right hand and has started having symptoms in their left. They report pain in both hands that is intermittent. The pain is sharp, usually worse in their thumbs and first two fingers. The pain sometimes shoots up their wrists. They report numbness that comes and goes in their last 3 fingers on both hands for the last month. They have had difficulty sewing and playing the piano. They fold boxes at work. They do not wear a wrist brace. They deny known injury.   Past Medical History:  Diagnosis Date  . ADHD (attention deficit hyperactivity disorder)   . Anxiety   . Asthma   . Bipolar 2 disorder (HCC)   . Depression   . Polycystic ovarian syndrome     Past Surgical History:  Procedure Laterality Date  . CHOLECYSTECTOMY N/A 10/20/2018   Procedure: LAPAROSCOPIC CHOLECYSTECTOMY;  Surgeon: Axel Filler, MD;  Location: [facility] OR;  Service: General;  Laterality: N/A;  . ENDOSCOPIC RETROGRADE CHOLANGIOPANCREATOGRAPHY (ERCP) WITH PROPOFOL N/A 10/21/2018   Procedure: ENDOSCOPIC RETROGRADE CHOLANGIOPANCREATOGRAPHY (ERCP) WITH PROPOFOL;  Surgeon: Vida Rigger, MD;  [facility] ENDOSCOPY;  Service: Endoscopy;  Laterality: N/A;  . REMOVAL OF STONES  10/21/2018   Procedure: REMOVAL OF STONES;  Surgeon: Vida Rigger, MD;  Location: [facility] ENDOSCOPY;  Service: Endoscopy;;  . Dennison Mascot  10/21/2018   Procedure: Dennison Mascot;  Surgeon: Vida Rigger, MD;  Location: [facility] ENDOSCOPY;  Service: Endoscopy;;  . WISDOM TOOTH EXTRACTION      Family History  Problem Relation Age of Onset  . Anxiety disorder Mother   . Drug abuse Mother   . Anxiety  disorder Maternal Aunt   . Anxiety disorder Maternal Grandmother   . Alcohol abuse Maternal Grandmother   . Alcohol abuse Paternal Grandmother   . Depression Paternal Grandmother   . Anxiety disorder Paternal Grandmother   . Drug abuse Maternal Aunt   . Asthma Sister   . Diabetes Maternal Grandfather   . Heart disease Paternal Grandfather     Social History   Socioeconomic History  . Marital status: Single    Spouse name: Not on file  . Number of children: Not on file  . Years of education: Not on file  . Highest education level: Not on file  Occupational History  . Not on file  Tobacco Use  . Smoking status: Never Smoker  . Smokeless tobacco: Never Used  Vaping Use  . Vaping Use: Never used  Substance and Sexual Activity  . Alcohol use: Yes    Comment: twice a month  . Drug use: No  . Sexual activity: Never    Birth control/protection: None  Other Topics Concern  . Not on file  Social History Narrative  . Not on file   Social Determinants of Health   Financial Resource Strain: Low Risk   . Difficulty of Paying Living Expenses: Not very hard  Food Insecurity: No Food Insecurity  . Worried About Programme researcher, broadcasting/film/video in the Last Year: Never true  . Ran Out of Food in the Last Year: Never true  Transportation Needs: No Transportation Needs  .  Lack of Transportation (Medical): No  . Lack of Transportation (Non-Medical): No  Physical Activity: Insufficiently Active  . Days of Exercise per Week: 1 day  . Minutes of Exercise per Session: 30 min  Stress: Stress Concern Present  . Feeling of Stress : Rather much  Social Connections: Socially Isolated  . Frequency of Communication with Friends and Family: Once a week  . Frequency of Social Gatherings with Friends and Family: Once a week  . Attends Religious Services: Never  . Active Member of Clubs or Organizations: No  . Attends Banker Meetings: Never  . Marital Status: Never married  Intimate Partner  Violence: Not At Risk  . Fear of Current or Ex-Partner: No  . Emotionally Abused: No  . Physically Abused: No  . Sexually Abused: No    Outpatient Medications Prior to Visit  Medication Sig Dispense Refill  . acetaminophen (TYLENOL) 500 MG tablet Take 1,000 mg by mouth every 6 (six) hours as needed for mild pain.    Marland Kitchen albuterol (PROVENTIL) (5 MG/ML) 0.5% nebulizer solution Inhale 0.5 mLs into the lungs every 4 (four) hours as needed for wheezing.    Marland Kitchen albuterol (VENTOLIN HFA) 108 (90 Base) MCG/ACT inhaler Inhale 2 puffs into the lungs every 6 (six) hours as needed for wheezing or shortness of breath. 18 g 5  . amphetamine-dextroamphetamine (ADDERALL XR) 5 MG 24 hr capsule Take 1 capsule by mouth daily.    . clonazePAM (KLONOPIN) 0.5 MG tablet Take 0.5 mg by mouth daily as needed.    . DULoxetine (CYMBALTA) 60 MG capsule Take 1 capsule (60 mg total) by mouth daily. (Patient taking differently: Take 120 mg by mouth daily.) 90 capsule 2  . fluticasone (FLONASE) 50 MCG/ACT nasal spray SPRAY 2 SPRAYS INTO EACH NOSTRIL EVERY DAY 16 mL 5  . ibuprofen (ADVIL,MOTRIN) 200 MG tablet Take 400 mg by mouth daily as needed for headache.    . lamoTRIgine (LAMICTAL) 200 MG tablet Take 200 mg by mouth at bedtime.    . medroxyPROGESTERone (PROVERA) 10 MG tablet Take 1 tablet (10 mg total) by mouth daily. X 10days every 3 months 10 tablet 3  . omeprazole (PRILOSEC) 20 MG capsule Take 1 capsule (20 mg total) by mouth daily. 90 capsule 3   No facility-administered medications prior to visit.    Allergies  Allergen Reactions  . Other     CATS    ROS Review of Systems As per HPI.    Objective:    Physical Exam Vitals and nursing note reviewed.  Constitutional:      General: Frances Clark is not in acute distress.    Appearance: Normal appearance. Frances Clark is not ill-appearing, toxic-appearing or diaphoretic.  Pulmonary:     Effort: Pulmonary effort is normal. No respiratory distress.   Musculoskeletal:     Right wrist: Normal.     Left wrist: Normal.     Right hand: Normal.     Left hand: Normal.     Comments: + Phalen's test  Skin:    General: Skin is warm and dry.  Neurological:     General: No focal deficit present.     Mental Status: Frances Clark is alert and oriented to person, place, and time.  Psychiatric:        Mood and Affect: Mood normal.        Behavior: Behavior normal.        Thought Content: Thought content normal.  Judgment: Judgment normal.     BP 117/74   Pulse 84   Temp 97.6 F (36.4 C) (Temporal)   Ht 5\' 4"  (1.626 m)   Wt 287 lb (130.2 kg)   BMI 49.26 kg/m  Wt Readings from Last 3 Encounters:  04/17/20 287 lb (130.2 kg)  12/20/19 291 lb 12.8 oz (132.4 kg)  12/11/19 296 lb 9.6 oz (134.5 kg)     Health Maintenance Due  Topic Date Due  . Hepatitis C Screening  Never done  . COVID-19 Vaccine (2 - Moderna 3-dose series) 07/16/2019    There are no preventive care reminders to display for this patient.  Lab Results  Component Value Date   TSH 3.760 06/18/2019   Lab Results  Component Value Date   WBC 9.0 10/22/2018   HGB 10.7 (L) 10/22/2018   HCT 34.6 (L) 10/22/2018   MCV 88.3 10/22/2018   PLT 330 10/22/2018   Lab Results  Component Value Date   NA 140 10/22/2018   K 3.7 10/22/2018   CO2 23 10/22/2018   GLUCOSE 114 (H) 10/22/2018   BUN 6 10/22/2018   CREATININE 0.69 10/22/2018   BILITOT 0.6 10/22/2018   ALKPHOS 119 10/22/2018   AST 343 (H) 10/22/2018   ALT 611 (H) 10/22/2018   PROT 6.0 (L) 10/22/2018   ALBUMIN 3.2 (L) 10/22/2018   CALCIUM 8.7 (L) 10/22/2018   ANIONGAP 11 10/22/2018   Lab Results  Component Value Date   CHOL 135 03/11/2016   Lab Results  Component Value Date   HDL 47 03/11/2016   Lab Results  Component Value Date   LDLCALC 71 03/11/2016   Lab Results  Component Value Date   TRIG 85 03/11/2016   Lab Results  Component Value Date   CHOLHDL 2.9 03/11/2016   Lab Results   Component Value Date   HGBA1C 5.6 06/18/2019      Assessment & Plan:   Frances Clark was seen today for hand pain.  Diagnoses and all orders for this visit:  Bilateral carpal tunnel syndrome Prednisone burst and naproxen given. Wrist splits were offered, however patient declined signing the paperwork as they were concerned about possible cost. Instructed patient that they can call their insurance to see if wrist splints would be covered and pick them up from the office if so. Patient declined PT today. Return to office for new or worsening symptoms, or if symptoms persist.  -     predniSONE (DELTASONE) 20 MG tablet; 2 po at sametime daily for 5 days -     naproxen (NAPROSYN) 500 MG tablet; Take 1 tablet (500 mg total) by mouth 2 (two) times daily with a meal.   Follow-up: As needed.   The patient indicates understanding of these issues and agrees with the plan.  Karma Ganja, FNP

## 2020-05-12 ENCOUNTER — Other Ambulatory Visit: Payer: Self-pay

## 2020-05-12 ENCOUNTER — Encounter: Payer: Self-pay | Admitting: Family Medicine

## 2020-05-12 ENCOUNTER — Ambulatory Visit (INDEPENDENT_AMBULATORY_CARE_PROVIDER_SITE_OTHER): Payer: 59 | Admitting: Family Medicine

## 2020-05-12 VITALS — BP 114/70 | HR 78 | Temp 97.7°F | Ht 64.0 in | Wt 288.0 lb

## 2020-05-12 DIAGNOSIS — M549 Dorsalgia, unspecified: Secondary | ICD-10-CM

## 2020-05-12 DIAGNOSIS — M5430 Sciatica, unspecified side: Secondary | ICD-10-CM | POA: Diagnosis not present

## 2020-05-12 MED ORDER — PREDNISONE 20 MG PO TABS: 20.0000 mg | ORAL_TABLET | Freq: Every day | ORAL | 0 refills | Status: AC

## 2020-05-12 NOTE — Progress Notes (Signed)
Acute Office Visit  Subjective:    Patient ID: Frances Clark, adult    DOB: [birth year], 20 y.o.   MRN: [id number]  Chief Complaint  Patient presents with  . Back Pain    HPI Patient is in today for back pain with bilateral sciatica. They report that this is chronic but has had some increase pain for a few days now. They report intermittent achy pain in their back with intermittent sharp shooting pains down their legs. They reports mild to moderate pain. They take tylenol and ibuprofen as needed. They also take Delta8 gummies as needed with good relief. They have done PT in the past, but they were only able to go to a few appointments due to conflicts with their work schedule. Denies changes in gait, saddle anesthesia, changes in bowel or bladder control, numbness or tingling. Denies injury or fall.   Past Medical History:  Diagnosis Date  . ADHD (attention deficit hyperactivity disorder)   . Anxiety   . Asthma   . Bipolar 2 disorder (HCC)   . Depression   . Polycystic ovarian syndrome     Past Surgical History:  Procedure Laterality Date  . CHOLECYSTECTOMY N/A 10/20/2018   Procedure: LAPAROSCOPIC CHOLECYSTECTOMY;  Surgeon: Axel Filler, MD;  Location: [facility] OR;  Service: General;  Laterality: N/A;  . ENDOSCOPIC RETROGRADE CHOLANGIOPANCREATOGRAPHY (ERCP) WITH PROPOFOL N/A 10/21/2018   Procedure: ENDOSCOPIC RETROGRADE CHOLANGIOPANCREATOGRAPHY (ERCP) WITH PROPOFOL;  Surgeon: Vida Rigger, MD;  Location: [facility] ENDOSCOPY;  Service: Endoscopy;  Laterality: N/A;  . REMOVAL OF STONES  10/21/2018   Procedure: REMOVAL OF STONES;  Surgeon: Vida Rigger, MD;  Location: [facility] ENDOSCOPY;  Service: Endoscopy;;  . Dennison Mascot  10/21/2018   Procedure: Dennison Mascot;  Surgeon: Vida Rigger, MD;  Location: [facility] ENDOSCOPY;  Service: Endoscopy;;  . WISDOM TOOTH EXTRACTION      Family History  Problem Relation Age of Onset  . Anxiety disorder Mother   . Drug abuse Mother   . Anxiety disorder Maternal Aunt    . Anxiety disorder Maternal Grandmother   . Alcohol abuse Maternal Grandmother   . Alcohol abuse Paternal Grandmother   . Depression Paternal Grandmother   . Anxiety disorder Paternal Grandmother   . Drug abuse Maternal Aunt   . Asthma Sister   . Diabetes Maternal Grandfather   . Heart disease Paternal Grandfather     Social History   Socioeconomic History  . Marital status: Single    Spouse name: Not on file  . Number of children: Not on file  . Years of education: Not on file  . Highest education level: Not on file  Occupational History  . Not on file  Tobacco Use  . Smoking status: Never Smoker  . Smokeless tobacco: Never Used  Vaping Use  . Vaping Use: Never used  Substance and Sexual Activity  . Alcohol use: Yes    Comment: twice a month  . Drug use: No  . Sexual activity: Never    Birth control/protection: None  Other Topics Concern  . Not on file  Social History Narrative  . Not on file   Social Determinants of Health   Financial Resource Strain: Low Risk   . Difficulty of Paying Living Expenses: Not very hard  Food Insecurity: No Food Insecurity  . Worried About Programme researcher, broadcasting/film/video in the Last Year: Never true  . Ran Out of Food in the Last Year: Never true  Transportation Needs: No Transportation Needs  . Lack of Transportation (  Medical): No  . Lack of Transportation (Non-Medical): No  Physical Activity: Insufficiently Active  . Days of Exercise per Week: 1 day  . Minutes of Exercise per Session: 30 min  Stress: Stress Concern Present  . Feeling of Stress : Rather much  Social Connections: Socially Isolated  . Frequency of Communication with Friends and Family: Once a week  . Frequency of Social Gatherings with Friends and Family: Once a week  . Attends Religious Services: Never  . Active Member of Clubs or Organizations: No  . Attends Banker Meetings: Never  . Marital Status: Never married  Intimate Partner Violence: Not At Risk  .  Fear of Current or Ex-Partner: No  . Emotionally Abused: No  . Physically Abused: No  . Sexually Abused: No    Outpatient Medications Prior to Visit  Medication Sig Dispense Refill  . acetaminophen (TYLENOL) 500 MG tablet Take 1,000 mg by mouth every 6 (six) hours as needed for mild pain.    Marland Kitchen albuterol (PROVENTIL) (5 MG/ML) 0.5% nebulizer solution Inhale 0.5 mLs into the lungs every 4 (four) hours as needed for wheezing.    Marland Kitchen albuterol (VENTOLIN HFA) 108 (90 Base) MCG/ACT inhaler Inhale 2 puffs into the lungs every 6 (six) hours as needed for wheezing or shortness of breath. 18 g 5  . amphetamine-dextroamphetamine (ADDERALL XR) 5 MG 24 hr capsule Take 1 capsule by mouth daily.    . clonazePAM (KLONOPIN) 0.5 MG tablet Take 0.5 mg by mouth daily as needed.    . DULoxetine (CYMBALTA) 60 MG capsule Take 1 capsule (60 mg total) by mouth daily. (Patient taking differently: Take 120 mg by mouth daily.) 90 capsule 2  . ibuprofen (ADVIL,MOTRIN) 200 MG tablet Take 400 mg by mouth daily as needed for headache.    . lamoTRIgine (LAMICTAL) 200 MG tablet Take 200 mg by mouth at bedtime.    . medroxyPROGESTERone (PROVERA) 10 MG tablet Take 1 tablet (10 mg total) by mouth daily. X 10days every 3 months 10 tablet 3  . omeprazole (PRILOSEC) 20 MG capsule Take 1 capsule (20 mg total) by mouth daily. 90 capsule 3  . fluticasone (FLONASE) 50 MCG/ACT nasal spray SPRAY 2 SPRAYS INTO EACH NOSTRIL EVERY DAY 16 mL 5  . naproxen (NAPROSYN) 500 MG tablet Take 1 tablet (500 mg total) by mouth 2 (two) times daily with a meal. 30 tablet 0  . predniSONE (DELTASONE) 20 MG tablet 2 po at sametime daily for 5 days 10 tablet 0   No facility-administered medications prior to visit.    Allergies  Allergen Reactions  . Other     CATS    Review of Systems As per HPI.     Objective:    Physical Exam Vitals and nursing note reviewed.  Constitutional:      General: AYLAH YEARY is not in acute distress.     Appearance: Normal appearance. RHYDER BRATZ is not ill-appearing, toxic-appearing or diaphoretic.  HENT:     Head: Normocephalic and atraumatic.  Cardiovascular:     Rate and Rhythm: Normal rate and regular rhythm.     Heart sounds: Normal heart sounds. No murmur heard.   Pulmonary:     Effort: Pulmonary effort is normal. No respiratory distress.     Breath sounds: Normal breath sounds.  Musculoskeletal:     Cervical back: Normal.     Thoracic back: Normal.     Lumbar back: No swelling, edema, deformity, signs of trauma, tenderness or  bony tenderness. Normal range of motion. Positive right straight leg raise test and positive left straight leg raise test.     Right lower leg: No edema.     Left lower leg: No edema.  Skin:    General: Skin is warm and dry.  Neurological:     General: No focal deficit present.     Mental Status: ROYETTA PROBUS is alert and oriented to person, place, and time.     Gait: Gait normal.  Psychiatric:        Mood and Affect: Mood normal.        Behavior: Behavior normal.     BP 114/70   Pulse 78   Temp 97.7 F (36.5 C) (Temporal)   Ht 5\' 4"  (1.626 m)   Wt 288 lb (130.6 kg)   BMI 49.44 kg/m  Wt Readings from Last 3 Encounters:  05/12/20 288 lb (130.6 kg)  04/17/20 287 lb (130.2 kg)  12/20/19 291 lb 12.8 oz (132.4 kg)    Health Maintenance Due  Topic Date Due  . Hepatitis C Screening  Never done  . COVID-19 Vaccine (2 - Moderna 3-dose series) 07/16/2019    There are no preventive care reminders to display for this patient.   Lab Results  Component Value Date   TSH 3.760 06/18/2019   Lab Results  Component Value Date   WBC 9.0 10/22/2018   HGB 10.7 (L) 10/22/2018   HCT 34.6 (L) 10/22/2018   MCV 88.3 10/22/2018   PLT 330 10/22/2018   Lab Results  Component Value Date   NA 140 10/22/2018   K 3.7 10/22/2018   CO2 23 10/22/2018   GLUCOSE 114 (H) 10/22/2018   BUN 6 10/22/2018   CREATININE 0.69 10/22/2018   BILITOT 0.6  10/22/2018   ALKPHOS 119 10/22/2018   AST 343 (H) 10/22/2018   ALT 611 (H) 10/22/2018   PROT 6.0 (L) 10/22/2018   ALBUMIN 3.2 (L) 10/22/2018   CALCIUM 8.7 (L) 10/22/2018   ANIONGAP 11 10/22/2018   Lab Results  Component Value Date   CHOL 135 03/11/2016   Lab Results  Component Value Date   HDL 47 03/11/2016   Lab Results  Component Value Date   LDLCALC 71 03/11/2016   Lab Results  Component Value Date   TRIG 85 03/11/2016   Lab Results  Component Value Date   CHOLHDL 2.9 03/11/2016   Lab Results  Component Value Date   HGBA1C 5.6 06/18/2019       Assessment & Plan:   Thereasa was seen today for back pain.  Diagnoses and all orders for this visit:  Back pain with sciatica No alarm signs, unremarkable exam. Normal lumber Xray in July on 2021. Prednisone burst ordered. Advil, heat as needed. Handout given with back stretches. They will notify provider if they would like referral to PT. May need imaging if symptoms persist. Return to office for new or worsening symptoms, or if symptoms persist.  -     predniSONE (DELTASONE) 20 MG tablet; Take 1 tablet (20 mg total) by mouth daily with breakfast for 5 days.  The patient indicates understanding of these issues and agrees with the plan.  2022, FNP

## 2020-05-12 NOTE — Patient Instructions (Addendum)
Sciatica  Sciatica is pain, numbness, weakness, or tingling along the path of the sciatic nerve. The sciatic nerve starts in the lower back and runs down the back of each leg. The nerve controls the muscles in the lower leg and in the back of the knee. It also provides feeling (sensation) to the back of the thigh, the lower leg, and the sole of the foot. Sciatica is a symptom of another medical condition that pinches or puts pressure on the sciatic nerve. Sciatica most often only affects one side of the body. Sciatica usually goes away on its own or with treatment. In some cases, sciatica may come back (recur). What are the causes? This condition is caused by pressure on the sciatic nerve or pinching of the nerve. This may be the result of:  A disk in between the bones of the spine bulging out too far (herniated disk).  Age-related changes in the spinal disks.  A pain disorder that affects a muscle in the buttock.  Extra bone growth near the sciatic nerve.  A break (fracture) of the pelvis.  Pregnancy.  Tumor. This is rare. What increases the risk? The following factors may make you more likely to develop this condition:  Playing sports that place pressure or stress on the spine.  Having poor strength and flexibility.  A history of back injury or surgery.  Sitting for long periods of time.  Doing activities that involve repetitive bending or lifting.  Obesity. What are the signs or symptoms? Symptoms can vary from mild to very severe, and they may include:  Any of these problems in the lower back, leg, hip, or buttock: ? Mild tingling, numbness, or dull aches. ? Burning sensations. ? Sharp pains.  Numbness in the back of the calf or the sole of the foot.  Leg weakness.  Severe back pain that makes movement difficult. Symptoms may get worse when you cough, sneeze, or laugh, or when you sit or stand for long periods of time. How is this diagnosed? This condition may be  diagnosed based on:  Your symptoms and medical history.  A physical exam.  Blood tests.  Imaging tests, such as: ? X-rays. ? MRI. ? CT scan. How is this treated? In many cases, this condition improves on its own without treatment. However, treatment may include:  Reducing or modifying physical activity.  Exercising and stretching.  Icing and applying heat to the affected area.  Medicines that help to: ? Relieve pain and swelling. ? Relax your muscles.  Injections of medicines that help to relieve pain, irritation, and inflammation around the sciatic nerve (steroids).  Surgery. Follow these instructions at home: Medicines  Take over-the-counter and prescription medicines only as told by your health care provider.  Ask your health care provider if the medicine prescribed to you: ? Requires you to avoid driving or using heavy machinery. ? Can cause constipation. You may need to take these actions to prevent or treat constipation:  Drink enough fluid to keep your urine pale yellow.  Take over-the-counter or prescription medicines.  Eat foods that are high in fiber, such as beans, whole grains, and fresh fruits and vegetables.  Limit foods that are high in fat and processed sugars, such as fried or sweet foods. Managing pain  If directed, put ice on the affected area. ? Put ice in a plastic bag. ? Place a towel between your skin and the bag. ? Leave the ice on for 20 minutes, 2-3 times a day.  If directed, apply heat to the affected area. Use the heat source that your health care provider recommends, such as a moist heat pack or a heating pad. ? Place a towel between your skin and the heat source. ? Leave the heat on for 20-30 minutes. ? Remove the heat if your skin turns bright red. This is especially important if you are unable to feel pain, heat, or cold. You may have a greater risk of getting burned.      Activity  Return to your normal activities as told by  your health care provider. Ask your health care provider what activities are safe for you.  Avoid activities that make your symptoms worse.  Take brief periods of rest throughout the day. ? When you rest for longer periods, mix in some mild activity or stretching between periods of rest. This will help to prevent stiffness and pain. ? Avoid sitting for long periods of time without moving. Get up and move around at least one time each hour.  Exercise and stretch regularly, as told by your health care provider.  Do not lift anything that is heavier than 10 lb (4.5 kg) while you have symptoms of sciatica. When you do not have symptoms, you should still avoid heavy lifting, especially repetitive heavy lifting.  When you lift objects, always use proper lifting technique, which includes: ? Bending your knees. ? Keeping the load close to your body. ? Avoiding twisting.   General instructions  Maintain a healthy weight. Excess weight puts extra stress on your back.  Wear supportive, comfortable shoes. Avoid wearing high heels.  Avoid sleeping on a mattress that is too soft or too hard. A mattress that is firm enough to support your back when you sleep may help to reduce your pain.  Keep all follow-up visits as told by your health care provider. This is important. Contact a health care provider if:  You have pain that: ? Wakes you up when you are sleeping. ? Gets worse when you lie down. ? Is worse than you have experienced in the past. ? Lasts longer than 4 weeks.  You have an unexplained weight loss. Get help right away if:  You are not able to control when you urinate or have bowel movements (incontinence).  You have: ? Weakness in your lower back, pelvis, buttocks, or legs that gets worse. ? Redness or swelling of your back. ? A burning sensation when you urinate. Summary  Sciatica is pain, numbness, weakness, or tingling along the path of the sciatic nerve.  This condition  is caused by pressure on the sciatic nerve or pinching of the nerve.  Sciatica can cause pain, numbness, or tingling in the lower back, legs, hips, and buttocks.  Treatment often includes rest, exercise, medicines, and applying ice or heat. This information is not intended to replace advice given to you by your health care provider. Make sure you discuss any questions you have with your health care provider. Document Revised: 03/20/2018 Document Reviewed: 03/20/2018 Elsevier Patient Education  2021 [employer]  Back Exercises The following exercises strengthen the muscles that help to support the trunk and back. They also help to keep the lower back flexible. Doing these exercises can help to prevent back pain or lessen existing pain. If you have back pain or discomfort, try doing these exercises 2-3 times each day or as told by your health care provider. As your pain improves, do them once each day, but increase the number of  times that you repeat the steps for each exercise (do more repetitions). To prevent the recurrence of back pain, continue to do these exercises once each day or as told by your health care provider. Do exercises exactly as told by your health care provider and adjust them as directed. It is normal to feel mild stretching, pulling, tightness, or discomfort as you do these exercises, but you should stop right away if you feel sudden pain or your pain gets worse. Exercises Single knee to chest Repeat these steps 3-5 times for each leg: Lie on your back on a firm bed or the floor with your legs extended. Bring one knee to your chest. Your other leg should stay extended and in contact with the floor. Hold your knee in place by grabbing your knee or thigh with both hands and hold. Pull on your knee until you feel a gentle stretch in your lower back or buttocks. Hold the stretch for 10-30 seconds. Slowly release and straighten your leg. Pelvic tilt Repeat these steps 5-10  times: Lie on your back on a firm bed or the floor with your legs extended. Bend your knees so they are pointing toward the ceiling and your feet are flat on the floor. Tighten your lower abdominal muscles to press your lower back against the floor. This motion will tilt your pelvis so your tailbone points up toward the ceiling instead of pointing to your feet or the floor. With gentle tension and even breathing, hold this position for 5-10 seconds. Cat-cow Repeat these steps until your lower back becomes more flexible: Get into a hands-and-knees position on a firm surface. Keep your hands under your shoulders, and keep your knees under your hips. You may place padding under your knees for comfort. Let your head hang down toward your chest. Contract your abdominal muscles and point your tailbone toward the floor so your lower back becomes rounded like the back of a cat. Hold this position for 5 seconds. Slowly lift your head, let your abdominal muscles relax and point your tailbone up toward the ceiling so your back forms a sagging arch like the back of a cow. Hold this position for 5 seconds.   Press-ups Repeat these steps 5-10 times: Lie on your abdomen (face-down) on the floor. Place your palms near your head, about shoulder-width apart. Keeping your back as relaxed as possible and keeping your hips on the floor, slowly straighten your arms to raise the top half of your body and lift your shoulders. Do not use your back muscles to raise your upper torso. You may adjust the placement of your hands to make yourself more comfortable. Hold this position for 5 seconds while you keep your back relaxed. Slowly return to lying flat on the floor.   Bridges Repeat these steps 10 times: Lie on your back on a firm surface. Bend your knees so they are pointing toward the ceiling and your feet are flat on the floor. Your arms should be flat at your sides, next to your body. Tighten your buttocks muscles  and lift your buttocks off the floor until your waist is at almost the same height as your knees. You should feel the muscles working in your buttocks and the back of your thighs. If you do not feel these muscles, slide your feet 1-2 inches farther away from your buttocks. Hold this position for 3-5 seconds. Slowly lower your hips to the starting position, and allow your buttocks muscles to relax completely. If  this exercise is too easy, try doing it with your arms crossed over your chest.   Abdominal crunches Repeat these steps 5-10 times: Lie on your back on a firm bed or the floor with your legs extended. Bend your knees so they are pointing toward the ceiling and your feet are flat on the floor. Cross your arms over your chest. Tip your chin slightly toward your chest without bending your neck. Tighten your abdominal muscles and slowly raise your trunk (torso) high enough to lift your shoulder blades a tiny bit off the floor. Avoid raising your torso higher than that because it can put too much stress on your low back and does not help to strengthen your abdominal muscles. Slowly return to your starting position. Back lifts Repeat these steps 5-10 times: Lie on your abdomen (face-down) with your arms at your sides, and rest your forehead on the floor. Tighten the muscles in your legs and your buttocks. Slowly lift your chest off the floor while you keep your hips pressed to the floor. Keep the back of your head in line with the curve in your back. Your eyes should be looking at the floor. Hold this position for 3-5 seconds. Slowly return to your starting position. Contact a health care provider if: Your back pain or discomfort gets much worse when you do an exercise. Your worsening back pain or discomfort does not lessen within 2 hours after you exercise. If you have any of these problems, stop doing these exercises right away. Do not do them again unless your health care provider says that  you can. Get help right away if: You develop sudden, severe back pain. If this happens, stop doing the exercises right away. Do not do them again unless your health care provider says that you can. This information is not intended to replace advice given to you by your health care provider. Make sure you discuss any questions you have with your health care provider. Document Revised: 07/06/2018 Document Reviewed: 12/01/2017 Elsevier Patient Education  2021 [employer].

## 2020-05-20 ENCOUNTER — Ambulatory Visit (INDEPENDENT_AMBULATORY_CARE_PROVIDER_SITE_OTHER): Payer: 59 | Admitting: Family Medicine

## 2020-05-20 ENCOUNTER — Other Ambulatory Visit: Payer: Self-pay

## 2020-05-20 ENCOUNTER — Encounter: Payer: Self-pay | Admitting: Family Medicine

## 2020-05-20 VITALS — BP 122/71 | HR 97 | Temp 97.6°F | Ht 64.0 in | Wt 286.6 lb

## 2020-05-20 DIAGNOSIS — M62830 Muscle spasm of back: Secondary | ICD-10-CM

## 2020-05-20 MED ORDER — PREDNISONE 10 MG PO TABS: ORAL_TABLET | ORAL | 0 refills | Status: DC

## 2020-05-20 MED ORDER — CYCLOBENZAPRINE HCL 10 MG PO TABS: 10.0000 mg | ORAL_TABLET | Freq: Every day | ORAL | 2 refills | Status: DC

## 2020-05-20 NOTE — Patient Instructions (Signed)

## 2020-05-20 NOTE — Progress Notes (Signed)
Subjective:  Patient ID: Frances Clark, adult    DOB: 2000/10/15  Age: 20 y.o. MRN: [id number]  CC: Back Pain   HPI RUCHI STONEY presents for one week of back pain getting worse in spite of prednisone scrip. Has pain In upper and lower back. Nerve pain radiating down both legs at the lateral thigh.  Pain is in the lumbar region in the paraspinous musculature according to where she points.  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  She is in and out of her car a lot.  However there is no specific known injury.  She does feel that this has contributed.  Depression screen [facility] 2/9 05/20/2020 12/11/2019 10/25/2019  Decreased Interest 0 1 1  Down, Depressed, Hopeless 0 1 1  PHQ - 2 Score 0 2 2  Altered sleeping - 1 2  Tired, decreased energy - 1 2  Change in appetite - 2 1  Feeling bad or failure about yourself  - 0 1  Trouble concentrating - 1 2  Moving slowly or fidgety/restless - 1 1  Suicidal thoughts - 0 0  PHQ-9 Score - 8 11  Difficult doing work/chores - - Very difficult    History Katessa has a past medical history of ADHD (attention deficit hyperactivity disorder), Anxiety, Asthma, Bipolar 2 disorder (HCC), Depression, and Polycystic ovarian syndrome.   EMILYANNE MCGOUGH has a past surgical history that includes Wisdom tooth extraction; Cholecystectomy (N/A, 10/20/2018); Endoscopic retrograde cholangiopancreatography (ercp) with propofol (N/A, 10/21/2018); sphincterotomy (10/21/2018); and removal of stones (10/21/2018).   Kailee R Sanson's family history includes Alcohol abuse in [place] R. Arthurs's maternal grandmother and paternal grandmother; Anxiety disorder in [place] R. Remo's maternal aunt, maternal grandmother, mother, and paternal grandmother; Asthma in [place]. Shrout's sister; Depression in [place] R. Barcellos's paternal grandmother; Diabetes in [place] R. Lauricella's maternal grandfather; Drug abuse in [place]. Alderfer's maternal aunt and mother; Heart disease in [place]  R. Chalmers's paternal grandfather.DEANGELA RANDLEMAN reports that [employer]. Otte has never smoked. Ashlan R. Rausch has never used smokeless tobacco. Edell R. Deeg reports current alcohol use. Feven R. Wubben reports that [employer]. Whisman does not use drugs.    ROS Review of Systems  Constitutional: Negative for fever.  Respiratory: Negative for shortness of breath.   Cardiovascular: Negative for chest pain.  Musculoskeletal: Negative for arthralgias.  Skin: Negative for rash.    Objective:  BP 122/71   Pulse 97   Temp 97.6 F (36.4 C)   Ht 5\' 4"  (1.626 m)   Wt 286 lb 9.6 oz (130 kg)   SpO2 100%   BMI 49.19 kg/m   BP Readings from Last 3 Encounters:  05/20/20 122/71  05/12/20 114/70  04/17/20 117/74    Wt Readings from Last 3 Encounters:  05/20/20 286 lb 9.6 oz (130 kg)  05/12/20 288 lb (130.6 kg)  04/17/20 287 lb (130.2 kg)     Physical Exam Constitutional:      General: Angelys R Chamber is not in acute distress.    Appearance: LEAH THORNBERRY is well-developed and well-nourished.  HENT:     Head: Normocephalic and atraumatic.  Eyes:     Extraocular Movements: EOM normal.     Conjunctiva/sclera: Conjunctivae normal.     Pupils: Pupils are equal, round, and reactive to light.  Cardiovascular:     Rate and Rhythm: Normal rate and regular rhythm.     Heart sounds: Normal heart sounds. No murmur heard.  Pulmonary:     Effort: Pulmonary effort is normal. No respiratory distress.     Breath sounds: Normal breath sounds. No wheezing or rales.  Abdominal:     General: Bowel sounds are normal. There is no distension.     Palpations: Abdomen is soft.     Tenderness: There is no abdominal tenderness.  Musculoskeletal:        General: Tenderness (With palpable spasm and lumbar paraspinous musculature L3-4 and 5 bilaterally.) present. No edema.     Cervical back: Normal range of motion and neck supple.     Lumbar back: Spasms and tenderness present. No  deformity. Decreased range of motion. Normal pulse.  Skin:    General: Skin is warm and dry.  Neurological:     Mental Status: CHARITI HAVEL is alert and oriented to person, place, and time.     Deep Tendon Reflexes: Reflexes are normal and symmetric.  Psychiatric:        Mood and Affect: Mood and affect normal.        Behavior: Behavior normal.        Thought Content: Thought content normal.       Assessment & Plan:   Hetty was seen today for back pain.  Diagnoses and all orders for this visit:  Lumbar paraspinal muscle spasm  Other orders -     predniSONE (DELTASONE) 10 MG tablet; Take 5 daily for 3 days followed by 4,3,2 and 1 for 3 days each. -     cyclobenzaprine (FLEXERIL) 10 MG tablet; Take 1 tablet (10 mg total) by mouth at bedtime. To relax muscles       I am having Madia R. Lazenby start on predniSONE and cyclobenzaprine. I am also having Odelle R. Moradi maintain [employer]. [place]'s ibuprofen, DULoxetine, acetaminophen, albuterol, amphetamine-dextroamphetamine, medroxyPROGESTERone, clonazePAM, albuterol, lamoTRIgine, and omeprazole.  Allergies as of 05/20/2020      Reactions   Other    CATS      Medication List       Accurate as of May 20, 2020  9:21 PM. If you have any questions, ask your nurse or doctor.        acetaminophen 500 MG tablet Commonly known as: TYLENOL Take 1,000 mg by mouth every 6 (six) hours as needed for mild pain.   albuterol (5 MG/ML) 0.5% nebulizer solution Commonly known as: PROVENTIL Inhale 0.5 mLs into the lungs every 4 (four) hours as needed for wheezing.   albuterol 108 (90 Base) MCG/ACT inhaler Commonly known as: VENTOLIN HFA Inhale 2 puffs into the lungs every 6 (six) hours as needed for wheezing or shortness of breath.   amphetamine-dextroamphetamine 5 MG 24 hr capsule Commonly known as: ADDERALL XR Take 1 capsule by mouth daily.   clonazePAM 0.5 MG tablet Commonly known as: KLONOPIN Take 0.5 mg by mouth  daily as needed.   cyclobenzaprine 10 MG tablet Commonly known as: FLEXERIL Take 1 tablet (10 mg total) by mouth at bedtime. To relax muscles Started by: Mechele Claude, MD   DULoxetine 60 MG capsule Commonly known as: CYMBALTA Take 1 capsule (60 mg total) by mouth daily. What changed: how much to take   ibuprofen 200 MG tablet Commonly known as: ADVIL Take 400 mg by mouth daily as needed for headache.   lamoTRIgine 200 MG tablet Commonly known as: LAMICTAL Take 200 mg by mouth at bedtime.   medroxyPROGESTERone 10 MG tablet Commonly known as: PROVERA Take 1 tablet (10 mg total) by mouth  daily. X 10days every 3 months   omeprazole 20 MG capsule Commonly known as: PRILOSEC Take 1 capsule (20 mg total) by mouth daily.   predniSONE 10 MG tablet Commonly known as: DELTASONE Take 5 daily for 3 days followed by 4,3,2 and 1 for 3 days each. Started by: Mechele Claude, MD        Follow-up: Return if symptoms worsen or fail to improve.  Mechele Claude, M.D.

## 2020-07-09 ENCOUNTER — Other Ambulatory Visit: Payer: Self-pay

## 2020-07-09 ENCOUNTER — Ambulatory Visit (INDEPENDENT_AMBULATORY_CARE_PROVIDER_SITE_OTHER): Payer: 59 | Admitting: Family Medicine

## 2020-07-09 ENCOUNTER — Encounter: Payer: Self-pay | Admitting: Family Medicine

## 2020-07-09 VITALS — BP 114/78 | HR 85 | Temp 97.9°F | Ht 64.0 in | Wt 293.0 lb

## 2020-07-09 DIAGNOSIS — M5441 Lumbago with sciatica, right side: Secondary | ICD-10-CM | POA: Diagnosis not present

## 2020-07-09 DIAGNOSIS — M5442 Lumbago with sciatica, left side: Secondary | ICD-10-CM

## 2020-07-09 DIAGNOSIS — G8929 Other chronic pain: Secondary | ICD-10-CM

## 2020-07-09 NOTE — Progress Notes (Signed)
Assessment & Plan:  1. Chronic midline low back pain with bilateral sciatica Encouraged to take ibuprofen 400 to 600 mg with Tylenol 500 mg three times daily during back flares.  Referred back to physical therapy.  Discussed I cannot order an MRI until she has failed 6 weeks of physical therapy.  We also discussed I do not think a referral to an orthopedic is appropriate at this time since she has not done physical therapy.  She was hoping to get another round of prednisone but I told her I cannot do this since she has already had 3 rounds in less than 3 months.   Follow up plan: Return in about 6 weeks (around 08/20/2020) for back pain.  Frances Boston, MSN, APRN, FNP-C [facility]  Subjective:   Patient ID: Frances Clark, adult    DOB: Jun 05, 2000, 20 y.o.   MRN: [id number]  HPI: Frances Clark is a 20 y.o. adult presenting on 07/09/2020 for Back Pain (Patient states that she has been having on and off lower back pain x 2 years that shot pains down bilateral legs.  States it is worse on right side.)  Patient presents with complaints of low back pain that radiates down both legs x2 years.  She states the pain is always there, but has flareups.  The radiation down her legs is worse on the right.  Her current flare started 5 days ago.  No new activities or injury.  She takes Tylenol and ibuprofen occasionally, but not every day.  When she takes Tylenol it is 2 tablets, but she does not know the dosage.  When she takes ibuprofen she takes 400 to 600 mg.  She was seen in February and March and has had 3 rounds of prednisone in less than 3 months.  She has been referred to physical therapy twice over the past year but states she was unable to go due to her work schedule.   ROS: Negative unless specifically indicated above in HPI.   Relevant past medical history reviewed and updated as indicated.   Allergies and medications reviewed and updated.   Current Outpatient  Medications:  .  acetaminophen (TYLENOL) 500 MG tablet, Take 1,000 mg by mouth every 6 (six) hours as needed for mild pain., Disp: , Rfl:  .  albuterol (PROVENTIL) (5 MG/ML) 0.5% nebulizer solution, Inhale 0.5 mLs into the lungs every 4 (four) hours as needed for wheezing., Disp: , Rfl:  .  albuterol (VENTOLIN HFA) 108 (90 Base) MCG/ACT inhaler, Inhale 2 puffs into the lungs every 6 (six) hours as needed for wheezing or shortness of breath., Disp: 18 g, Rfl: 5 .  amphetamine-dextroamphetamine (ADDERALL XR) 5 MG 24 hr capsule, Take 1 capsule by mouth daily., Disp: , Rfl:  .  clonazePAM (KLONOPIN) 0.5 MG tablet, Take 0.5 mg by mouth daily as needed., Disp: , Rfl:  .  cyclobenzaprine (FLEXERIL) 10 MG tablet, Take 1 tablet (10 mg total) by mouth at bedtime. To relax muscles, Disp: 30 tablet, Rfl: 2 .  DULoxetine (CYMBALTA) 60 MG capsule, Take 1 capsule (60 mg total) by mouth daily. (Patient taking differently: Take 120 mg by mouth daily.), Disp: 90 capsule, Rfl: 2 .  ibuprofen (ADVIL,MOTRIN) 200 MG tablet, Take 400 mg by mouth daily as needed for headache., Disp: , Rfl:  .  lamoTRIgine (LAMICTAL) 200 MG tablet, Take 200 mg by mouth at bedtime., Disp: , Rfl:  .  medroxyPROGESTERone (PROVERA) 10 MG tablet, Take 1 tablet (  10 mg total) by mouth daily. X 10days every 3 months, Disp: 10 tablet, Rfl: 3 .  omeprazole (PRILOSEC) 20 MG capsule, Take 1 capsule (20 mg total) by mouth daily., Disp: 90 capsule, Rfl: 3  Allergies  Allergen Reactions  . Other     CATS    Objective:   BP 114/78   Pulse 85   Temp 97.9 F (36.6 C) (Temporal)   Ht 5\' 4"  (1.626 m)   Wt 293 lb (132.9 kg)   SpO2 94%   BMI 50.29 kg/m    Physical Exam Vitals reviewed.  Constitutional:      General: [name] is not in acute distress.    Appearance: Normal appearance. [name] is not ill-appearing, toxic-appearing or diaphoretic.  HENT:     Head: Normocephalic and atraumatic.  Eyes:     General: No scleral  icterus.       Right eye: No discharge.        Left eye: No discharge.     Conjunctiva/sclera: Conjunctivae normal.  Cardiovascular:     Rate and Rhythm: Normal rate and regular rhythm.     Heart sounds: Normal heart sounds. No murmur heard. No friction rub. No gallop.   Pulmonary:     Effort: Pulmonary effort is normal. No respiratory distress.     Breath sounds: Normal breath sounds. No stridor. No wheezing, rhonchi or rales.  Musculoskeletal:        General: Normal range of motion.     Cervical back: Normal range of motion.     Lumbar back: Tenderness present.  Skin:    General: Skin is warm and dry.  Neurological:     Mental Status: [name] is alert and oriented to person, place, and time. Mental status is at baseline.  Psychiatric:        Mood and Affect: Mood normal.        Behavior: Behavior normal.        Thought Content: Thought content normal.        Judgment: Judgment normal.

## 2020-07-09 NOTE — Patient Instructions (Signed)
Ibuprofen 2-3 tablets (400-600 mg) with Tylenol 500 mg three times per day while you are in the flare up of back pain.

## 2020-07-22 ENCOUNTER — Other Ambulatory Visit: Payer: Self-pay

## 2020-07-22 ENCOUNTER — Encounter: Payer: Self-pay | Admitting: Physical Therapy

## 2020-07-22 ENCOUNTER — Ambulatory Visit: Payer: 59 | Attending: Family Medicine | Admitting: Physical Therapy

## 2020-07-22 DIAGNOSIS — G8929 Other chronic pain: Secondary | ICD-10-CM | POA: Diagnosis not present

## 2020-07-22 DIAGNOSIS — M5442 Lumbago with sciatica, left side: Secondary | ICD-10-CM | POA: Diagnosis not present

## 2020-07-22 DIAGNOSIS — M5441 Lumbago with sciatica, right side: Secondary | ICD-10-CM | POA: Diagnosis not present

## 2020-07-22 DIAGNOSIS — R293 Abnormal posture: Secondary | ICD-10-CM | POA: Insufficient documentation

## 2020-07-22 NOTE — Therapy (Signed)
[facility] [address] [place], [place], [address] Phone: [phone]   Fax:  [phone]  Physical Therapy Evaluation  Patient Details  Name: Frances Clark MRN: [id number] Date of Birth: [birth year] Referring Provider (PT): Deliah Boston   Encounter Date: 07/22/2020   PT End of Session - 07/22/20 0921    Visit Number 1    Number of Visits 12    Date for PT Re-Evaluation 09/09/20    PT Start Time 0815    PT Stop Time 0841    PT Time Calculation (min) 26 min    Activity Tolerance Patient tolerated treatment well    Behavior During Therapy [facility] for tasks assessed/performed           Past Medical History:  Diagnosis Date  . ADHD (attention deficit hyperactivity disorder)   . Anxiety   . Asthma   . Bipolar 2 disorder (HCC)   . Depression   . Polycystic ovarian syndrome     Past Surgical History:  Procedure Laterality Date  . CHOLECYSTECTOMY N/A 10/20/2018   Procedure: LAPAROSCOPIC CHOLECYSTECTOMY;  Surgeon: Axel Filler, MD;  Location: [facility] OR;  Service: General;  Laterality: N/A;  . ENDOSCOPIC RETROGRADE CHOLANGIOPANCREATOGRAPHY (ERCP) WITH PROPOFOL N/A 10/21/2018   Procedure: ENDOSCOPIC RETROGRADE CHOLANGIOPANCREATOGRAPHY (ERCP) WITH PROPOFOL;  Surgeon: Vida Rigger, MD;  Location: [facility] ENDOSCOPY;  Service: Endoscopy;  Laterality: N/A;  . REMOVAL OF STONES  10/21/2018   Procedure: REMOVAL OF STONES;  Surgeon: Vida Rigger, MD;  Location: [facility] ENDOSCOPY;  Service: Endoscopy;;  . Dennison Mascot  10/21/2018   Procedure: Dennison Mascot;  Surgeon: Vida Rigger, MD;  Location: [facility] ENDOSCOPY;  Service: Endoscopy;;  . WISDOM TOOTH EXTRACTION      There were no vitals filed for this visit.    Subjective Assessment - 07/22/20 0920    Subjective COVID-19 screen performed prior to patient entering clinic.  The patient returns to the clinic today with continued c/o low back pain with radiation into bilateral hips and anterior thighs.  Bending  and proloned walking and standing increase her pain.  The patient's resting pain-level can get much higher with wiht the aforementioned activties.  Sitting and lying can decrease the patient's pain.    Pertinent History ADHD, Anxiety, Asthma, Bipolar Disorder 2, Depression    Limitations Sitting;Lifting;Walking;Standing;House hold activities    How long can you sit comfortably? "few hours"    How long can you stand comfortably? "5-10 minutes"    How long can you walk comfortably? "<10 minutes"    Diagnostic tests x-ray: normal results    Patient Stated Goals return to PLOF    Currently in Pain? Yes    Pain Score 4     Pain Location Back    Pain Orientation Lower;Mid;Right;Left    Pain Descriptors / Indicators Aching;Sore;Shooting    Pain Onset More than a month ago    Pain Frequency Constant    Aggravating Factors  See above.    Pain Relieving Factors See above.              [facility] PT Assessment - 07/22/20 0001      Assessment   Medical Diagnosis Chronic midline LBP with bilateral sciatica.    Referring Provider (PT) Deliah Boston    Onset Date/Surgical Date --   2 years+.     Precautions   Precautions None      Home Environment   Living Environment Private residence      Prior Function   Level of Independence  Independent      Posture/Postural Control   Posture/Postural Control Postural limitations    Postural Limitations Rounded Shoulders;Forward head    Posture Comments Bilateral genu recurvatum.      Deep Tendon Reflexes   DTR Assessment Site Patella;Achilles    Patella DTR 2+    Achilles DTR 2+      ROM / Strength   AROM / PROM / Strength AROM;Strength      AROM   Overall AROM Comments Full active lumbar flexion (hypermobility with regards to intervertebral movement) and full lumbar extension.      Strength   Overall Strength Comments Normal bilateral LE strength.      Palpation   Palpation comment Pain complaints generally across her lower lumbar region.       Special Tests   Other special tests Equal leg lengths, (-) SLR and FABER testing.      Ambulation/Gait   Gait Comments WNL.                      Objective measurements completed on examination: See above findings.                 PT Short Term Goals - 12/31/19 1952      PT SHORT TERM GOAL #1   Title Patient will be independent with HEP    Baseline no knowledge of HEP    Time 3    Period Weeks    Status New      PT SHORT TERM GOAL #2   Title Patient will demonstrate proper log rolling bed mobility mechanics to protects spine during bed transitions.    Baseline supine to long sit transfer.    Time 3    Period Weeks    Status New             PT Long Term Goals - 07/22/20 1209      PT LONG TERM GOAL #1   Title Patient will be independent with advanced HEP    Time 6    Period Weeks    Status New      PT LONG TERM GOAL #2   Title Patient will report ability to walk around community for 20 mins or greater with low back pain less than or equal to 3/10    Baseline 5-10 minute average pain can increase to 7/10    Time 6    Period Weeks    Status New      PT LONG TERM GOAL #3   Title Patient will report a centralization of neurological symptoms to low back or report no neurological symptoms to indicate no nerve irritation.    Baseline bilateral neurological symptoms to bilateral anterior thighs.    Time 6    Period Weeks    Status New      PT LONG TERM GOAL #4   Title Patient will report ability to perform ADLs with low back pain less than or equal to 3/10.    Baseline Pain levels increase to 7+/10    Time 6    Period Weeks    Status New                  Plan - 07/22/20 1205    Clinical Impression Statement The patient presents to OPPT with c/o chronic low back pain with radiation into both hips and anterior thighs.  Her pain increases the longer she stands and walks.  Her active lumbar flexion  is remarkable for  hypermobility.  Her LE strength and DTR's are normal.  Special testing is negative.  Patient will benefit from skilled physical therapy intervention to address pain and deficits.    Personal Factors and Comorbidities Comorbidity 1    Comorbidities ADHD, Anxiety, Asthma, Bipolar Disorder 2, Depression    Examination-Activity Limitations Other;Stand;Locomotion Level    Examination-Participation Restrictions Other    Stability/Clinical Decision Making Stable/Uncomplicated    Clinical Decision Making Low    Rehab Potential Good    PT Frequency 2x / week    PT Duration 6 weeks    PT Treatment/Interventions ADLs/Self Care Home Management;Ultrasound;Traction;Moist Heat;Therapeutic activities;Therapeutic exercise;Balance training;Neuromuscular re-education;Manual techniques;Passive range of motion;Patient/family education    PT Next Visit Plan Nustep, core stabilization.           Patient will benefit from skilled therapeutic intervention in order to improve the following deficits and impairments:  Pain,Postural dysfunction,Decreased activity tolerance  Visit Diagnosis: Chronic bilateral low back pain with bilateral sciatica - Plan: PT plan of care cert/re-cert  Abnormal posture - Plan: PT plan of care cert/re-cert     Problem List Patient Active Problem List   Diagnosis Date Noted  . 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 Armpit abscess 10/25/2019  . Axillary hidradenitis suppurativa 09/25/2019  . Cellulitis of external ear, left 09/25/2019  . Hypersomnolence 09/03/2019  . Mild intermittent asthma without complication 02/14/2017  . Bipolar depression (HCC) 02/14/2017  . Severe major depression (HCC) 03/10/2016    Ezell Melikian, [place] MPT 07/22/2020, 12:34 PM  [facility] [address] [place], [place], [address] Phone: [phone]   Fax:  [phone]  Name: HALCYON HECK MRN: [id number] Date of Birth: [birth year]

## 2020-08-07 DIAGNOSIS — X58XXXA Exposure to other specified factors, initial encounter: Secondary | ICD-10-CM | POA: Diagnosis not present

## 2020-08-07 DIAGNOSIS — S39012A Strain of muscle, fascia and tendon of lower back, initial encounter: Secondary | ICD-10-CM | POA: Diagnosis not present

## 2020-08-13 ENCOUNTER — Encounter: Payer: Self-pay | Admitting: Nurse Practitioner

## 2020-08-13 ENCOUNTER — Other Ambulatory Visit: Payer: Self-pay

## 2020-08-13 ENCOUNTER — Ambulatory Visit (INDEPENDENT_AMBULATORY_CARE_PROVIDER_SITE_OTHER): Payer: 59 | Admitting: Nurse Practitioner

## 2020-08-13 VITALS — BP 125/80 | HR 75 | Temp 97.8°F | Ht 64.0 in | Wt 295.0 lb

## 2020-08-13 DIAGNOSIS — K219 Gastro-esophageal reflux disease without esophagitis: Secondary | ICD-10-CM

## 2020-08-13 DIAGNOSIS — R110 Nausea: Secondary | ICD-10-CM | POA: Diagnosis not present

## 2020-08-13 DIAGNOSIS — R195 Other fecal abnormalities: Secondary | ICD-10-CM

## 2020-08-13 DIAGNOSIS — R1013 Epigastric pain: Secondary | ICD-10-CM

## 2020-08-13 MED ORDER — ONDANSETRON HCL 4 MG PO TABS: 4.0000 mg | ORAL_TABLET | Freq: Three times a day (TID) | ORAL | 0 refills | Status: DC | PRN

## 2020-08-13 MED ORDER — OMEPRAZOLE 40 MG PO CPDR: 40.0000 mg | DELAYED_RELEASE_CAPSULE | Freq: Every day | ORAL | 3 refills | Status: DC

## 2020-08-13 MED ORDER — METAMUCIL 0.52 G PO CAPS: 0.5200 g | ORAL_CAPSULE | Freq: Every day | ORAL | 0 refills | Status: DC

## 2020-08-13 NOTE — Assessment & Plan Note (Signed)
Started patient on Prilosec 40 mg tablet by mouth daily.  Education provided to patient with printed handouts given.  Advised to reduce spicy foods.  Wait at least 2 to 3 hours after meals before going to sleep.  Rx sent to pharmacy.  Patient verbalized understanding

## 2020-08-13 NOTE — Assessment & Plan Note (Signed)
Epigastric pain is worsening in the last few weeks.  Patient is not taking any medication currently for GERD.  No vomiting but slight nausea.  Zofran 4 mg tablet by mouth every 8 hours for nausea.  Rx sent to pharmacy.  Follow-up with worsening unresolved symptoms.

## 2020-08-13 NOTE — Patient Instructions (Signed)
Gastroesophageal Reflux Disease, Adult  Gastroesophageal reflux (GER) happens when acid from the stomach flows up into the tube that connects the mouth and the stomach (esophagus). Normally, food travels down the esophagus and stays in the stomach to be digested. With GER, food and stomach acid sometimes move back up into the esophagus. You may have a disease called gastroesophageal reflux disease (GERD) if the reflux:  Happens often.  Causes frequent or very bad symptoms.  Causes problems such as damage to the esophagus. When this happens, the esophagus becomes sore and swollen. Over time, GERD can make small holes (ulcers) in the lining of the esophagus. What are the causes? This condition is caused by a problem with the muscle between the esophagus and the stomach. When this muscle is weak or not normal, it does not close properly to keep food and acid from coming back up from the stomach. The muscle can be weak because of:  Tobacco use.  Pregnancy.  Having a certain type of hernia (hiatal hernia).  Alcohol use.  Certain foods and drinks, such as coffee, chocolate, onions, and peppermint. What increases the risk?  Being overweight.  Having a disease that affects your connective tissue.  Taking NSAIDs, such a ibuprofen. What are the signs or symptoms?  Heartburn.  Difficult or painful swallowing.  The feeling of having a lump in the throat.  A bitter taste in the mouth.  Bad breath.  Having a lot of saliva.  Having an upset or bloated stomach.  Burping.  Chest pain. Different conditions can cause chest pain. Make sure you see your doctor if you have chest pain.  Shortness of breath or wheezing.  A long-term cough or a cough at night.  Wearing away of the surface of teeth (tooth enamel).  Weight loss. How is this treated?  Making changes to your diet.  Taking medicine.  Having surgery. Treatment will depend on how bad your symptoms are. Follow these  instructions at home: Eating and drinking  Follow a diet as told by your doctor. You may need to avoid foods and drinks such as: ? Coffee and tea, with or without caffeine. ? Drinks that contain alcohol. ? Energy drinks and sports drinks. ? Bubbly (carbonated) drinks or sodas. ? Chocolate and cocoa. ? Peppermint and mint flavorings. ? Garlic and onions. ? Horseradish. ? Spicy and acidic foods. These include peppers, chili powder, curry powder, vinegar, hot sauces, and BBQ sauce. ? Citrus fruit juices and citrus fruits, such as oranges, lemons, and limes. ? Tomato-based foods. These include red sauce, chili, salsa, and pizza with red sauce. ? Fried and fatty foods. These include donuts, french fries, potato chips, and high-fat dressings. ? High-fat meats. These include hot dogs, rib eye steak, sausage, ham, and bacon. ? High-fat dairy items, such as whole milk, butter, and cream cheese.  Eat small meals often. Avoid eating large meals.  Avoid drinking large amounts of liquid with your meals.  Avoid eating meals during the 2-3 hours before bedtime.  Avoid lying down right after you eat.  Do not exercise right after you eat.   Lifestyle  Do not smoke or use any products that contain nicotine or tobacco. If you need help quitting, ask your doctor.  Try to lower your stress. If you need help doing this, ask your doctor.  If you are overweight, lose an amount of weight that is healthy for you. Ask your doctor about a safe weight loss goal.   General instructions    Pay attention to any changes in your symptoms.  Take over-the-counter and prescription medicines only as told by your doctor.  Do not take aspirin, ibuprofen, or other NSAIDs unless your doctor says it is okay.  Wear loose clothes. Do not wear anything tight around your waist.  Raise (elevate) the head of your bed about 6 inches (15 cm). You may need to use a wedge to do this.  Avoid bending over if this makes your  symptoms worse.  Keep all follow-up visits. Contact a doctor if:  You have new symptoms.  You lose weight and you do not know why.  You have trouble swallowing or it hurts to swallow.  You have wheezing or a cough that keeps happening.  You have a hoarse voice.  Your symptoms do not get better with treatment. Get help right away if:  You have sudden pain in your arms, neck, jaw, teeth, or back.  You suddenly feel sweaty, dizzy, or light-headed.  You have chest pain or shortness of breath.  You vomit and the vomit is green, yellow, or black, or it looks like blood or coffee grounds.  You faint.  Your poop (stool) is red, bloody, or black.  You cannot swallow, drink, or eat. These symptoms may represent a serious problem that is an emergency. Do not wait to see if the symptoms will go away. Get medical help right away. Call your local emergency services (911 in the [place] Do not drive yourself to the hospital. Summary  If a person has gastroesophageal reflux disease (GERD), food and stomach acid move back up into the esophagus and cause symptoms or problems such as damage to the esophagus.  Treatment will depend on how bad your symptoms are.  Follow a diet as told by your doctor.  Take all medicines only as told by your doctor. This information is not intended to replace advice given to you by your health care provider. Make sure you discuss any questions you have with your health care provider. Document Revised: 09/10/2019 Document Reviewed: 09/10/2019 Elsevier Patient Education  2021 [employer] Diarrhea, Adult Diarrhea is when you pass loose and watery poop (stool) often. Diarrhea can make you feel weak and cause you to lose water in your body (get dehydrated). Losing water in your body can cause you to:  Feel tired and thirsty.  Have a dry mouth.  Go pee (urinate) less often. Diarrhea often lasts 2-3 days. However, it can last longer if it is a sign of  something more serious. It is important to treat your diarrhea as told by your doctor. Follow these instructions at home: Eating and drinking Follow these instructions as told by your doctor:  Take an ORS (oral rehydration solution). This is a drink that helps you replace fluids and minerals your body lost. It is sold at pharmacies and stores.  Drink plenty of fluids, such as: ? Water. ? Ice chips. ? Diluted fruit juice. ? Low-calorie sports drinks. ? Milk, if you want.  Avoid drinking fluids that have a lot of sugar or caffeine in them.  Eat bland, easy-to-digest foods in small amounts as you are able. These foods include: ? Bananas. ? Applesauce. ? Rice. ? Low-fat (lean) meats. ? Toast. ? Crackers.  Avoid alcohol.  Avoid spicy or fatty foods.      Medicines  Take over-the-counter and prescription medicines only as told by your doctor.  If you were prescribed an antibiotic medicine, take it as told by your doctor.  Do not stop using the antibiotic even if you start to feel better. General instructions  Wash your hands often using soap and water. If soap and water are not available, use a hand sanitizer. Others in your home should wash their hands as well. Hands should be washed: ? After using the toilet or changing a diaper. ? Before preparing, cooking, or serving food. ? While caring for a sick person. ? While visiting someone in a hospital.  Drink enough fluid to keep your pee (urine) pale yellow.  Rest at home while you get better.  Watch your condition for any changes.  Take a warm bath to help with any burning or pain from having diarrhea.  Keep all follow-up visits as told by your doctor. This is important.   Contact a doctor if:  You have a fever.  Your diarrhea gets worse.  You have new symptoms.  You cannot keep fluids down.  You feel light-headed or dizzy.  You have a headache.  You have muscle cramps. Get help right away if:  You have  chest pain.  You feel very weak or you pass out (faint).  You have bloody or black poop or poop that looks like tar.  You have very bad pain, cramping, or bloating in your belly (abdomen).  You have trouble breathing or you are breathing very quickly.  Your heart is beating very quickly.  Your skin feels cold and clammy.  You feel confused.  You have signs of losing too much water in your body, such as: ? Dark pee, very little pee, or no pee. ? Cracked lips. ? Dry mouth. ? Sunken eyes. ? Sleepiness. ? Weakness. Summary  Diarrhea is when you pass loose and watery poop (stool) often.  Diarrhea can make you feel weak and cause you to lose water in your body (get dehydrated).  Take an ORS (oral rehydration solution). This is a drink that is sold at pharmacies and stores.  Eat bland, easy-to-digest foods in small amounts as you are able.  Contact a doctor if your condition gets worse. Get help right away if you have signs that you have lost too much water in your body. This information is not intended to replace advice given to you by your health care provider. Make sure you discuss any questions you have with your health care provider. Document Revised: 08/05/2017 Document Reviewed: 08/05/2017 Elsevier Patient Education  2021 Elsevier Inc. Abdominal Pain, Adult Many things can cause belly (abdominal) pain. Most times, belly pain is not dangerous. Many cases of belly pain can be watched and treated at home. Sometimes, though, belly pain is serious. Your doctor will try to find the cause of your belly pain. Follow these instructions at home: Medicines  Take over-the-counter and prescription medicines only as told by your doctor.  Do not take medicines that help you poop (laxatives) unless told by your doctor. General instructions  Watch your belly pain for any changes.  Drink enough fluid to keep your pee (urine) pale yellow.  Keep all follow-up visits as told by your  doctor. This is important.   Contact a doctor if:  Your belly pain changes or gets worse.  You are not hungry, or you lose weight without trying.  You are having trouble pooping (constipated) or have watery poop (diarrhea) for more than 2-3 days.  You have pain when you pee or poop.  Your belly pain wakes you up at night.  Your pain gets worse with meals,  after eating, or with certain foods.  You are vomiting and cannot keep anything down.  You have a fever.  You have blood in your pee. Get help right away if:  Your pain does not go away as soon as your doctor says it should.  You cannot stop vomiting.  Your pain is only in areas of your belly, such as the right side or the left lower part of the belly.  You have bloody or black poop, or poop that looks like tar.  You have very bad pain, cramping, or bloating in your belly.  You have signs of not having enough fluid or water in your body (dehydration), such as: ? Dark pee, very little pee, or no pee. ? Cracked lips. ? Dry mouth. ? Sunken eyes. ? Sleepiness. ? Weakness.  You have trouble breathing or chest pain. Summary  Many cases of belly pain can be watched and treated at home.  Watch your belly pain for any changes.  Take over-the-counter and prescription medicines only as told by your doctor.  Contact a doctor if your belly pain changes or gets worse.  Get help right away if you have very bad pain, cramping, or bloating in your belly. This information is not intended to replace advice given to you by your health care provider. Make sure you discuss any questions you have with your health care provider. Document Revised: 07/10/2018 Document Reviewed: 07/10/2018 Elsevier Patient Education  2021 [employer].

## 2020-08-13 NOTE — Assessment & Plan Note (Signed)
Zofran 4 mg tablet by mouth as needed every 8 hours for nausea.

## 2020-08-13 NOTE — Progress Notes (Signed)
Acute Office Visit  Subjective:    Patient ID: Frances Clark, adult    DOB: 06/03/2000, 20 y.o.   MRN: [id number]  Chief Complaint  Patient presents with  . Abdominal Pain  . Nausea    Abdominal Pain This is a recurrent problem. The current episode started in the past 7 days. The onset quality is gradual. The problem occurs constantly. The problem has been unchanged. The pain is located in the epigastric region. The pain is moderate. The quality of the pain is dull and burning. The abdominal pain radiates to the epigastric region. Associated symptoms include diarrhea and nausea. Nothing aggravates the pain. The pain is relieved by nothing. [name]'s past medical history is significant for GERD.  Diarrhea  This is a recurrent problem. The current episode started in the past 7 days. The problem occurs 2 to 4 times per day. The problem has been unchanged. Diarrhea characteristics: mushy. Associated symptoms include abdominal pain. Nothing aggravates the symptoms. Frances SpellerCarley R Hendrix has tried nothing for the symptoms.     Past Medical History:  Diagnosis Date  . ADHD (attention deficit hyperactivity disorder)   . Anxiety   . Asthma   . Bipolar 2 disorder (HCC)   . Depression   . Polycystic ovarian syndrome     Past Surgical History:  Procedure Laterality Date  . CHOLECYSTECTOMY N/A 10/20/2018   Procedure: LAPAROSCOPIC CHOLECYSTECTOMY;  Surgeon: Axel Filleramirez, Armando, MD;  [facility];  Service: General;  Laterality: N/A;  . ENDOSCOPIC RETROGRADE CHOLANGIOPANCREATOGRAPHY (ERCP) WITH PROPOFOL N/A 10/21/2018   Procedure: ENDOSCOPIC RETROGRADE CHOLANGIOPANCREATOGRAPHY (ERCP) WITH PROPOFOL;  Surgeon: Vida RiggerMagod, Marc, MD;  [facility];  Service: Endoscopy;  Laterality: N/A;  . REMOVAL OF STONES  10/21/2018   Procedure: REMOVAL OF STONES;  Surgeon: Vida RiggerMagod, Marc, MD;  [facility];  Service: Endoscopy;;  . Dennison MascotSPHINCTEROTOMY  10/21/2018   Procedure: Dennison MascotSPHINCTEROTOMY;  Surgeon:  Vida RiggerMagod, Marc, MD;  [facility];  Service: Endoscopy;;  . WISDOM TOOTH EXTRACTION      Family History  Problem Relation Age of Onset  . Anxiety disorder Mother   . Drug abuse Mother   . Anxiety disorder Maternal Aunt   . Anxiety disorder Maternal Grandmother   . Alcohol abuse Maternal Grandmother   . Alcohol abuse Paternal Grandmother   . Depression Paternal Grandmother   . Anxiety disorder Paternal Grandmother   . Drug abuse Maternal Aunt   . Asthma Sister   . Diabetes Maternal Grandfather   . Heart disease Paternal Grandfather     Social History   Socioeconomic History  . Marital status: Single    Spouse name: Not on file  . Number of children: Not on file  . Years of education: Not on file  . Highest education level: Not on file  Occupational History  . Not on file  Tobacco Use  . Smoking status: Never Smoker  . Smokeless tobacco: Never Used  Vaping Use  . Vaping Use: Never used  Substance and Sexual Activity  . Alcohol use: Yes    Comment: twice a month  . Drug use: No  . Sexual activity: Never    Birth control/protection: None  Other Topics Concern  . Not on file  Social History Narrative  . Not on file   Social Determinants of Health   Financial Resource Strain: Not on file  Food Insecurity: Not on file  Transportation Needs: Not on file  Physical Activity: Not on file  Stress: Not on  file  Social Connections: Not on file  Intimate Partner Violence: Not on file    Outpatient Medications Prior to Visit  Medication Sig Dispense Refill  . acetaminophen (TYLENOL) 500 MG tablet Take 1,000 mg by mouth every 6 (six) hours as needed for mild pain.    Marland Kitchen albuterol (VENTOLIN HFA) 108 (90 Base) MCG/ACT inhaler Inhale 2 puffs into the lungs every 6 (six) hours as needed for wheezing or shortness of breath. 18 g 5  . amphetamine-dextroamphetamine (ADDERALL XR) 5 MG 24 hr capsule Take 1 capsule by mouth daily.    . clonazePAM (KLONOPIN) 0.5 MG tablet Take  0.5 mg by mouth daily as needed.    . DULoxetine (CYMBALTA) 60 MG capsule Take 1 capsule (60 mg total) by mouth daily. (Patient taking differently: Take 120 mg by mouth daily.) 90 capsule 2  . ibuprofen (ADVIL,MOTRIN) 200 MG tablet Take 400 mg by mouth daily as needed for headache.    . lamoTRIgine (LAMICTAL) 200 MG tablet Take 200 mg by mouth at bedtime.    . medroxyPROGESTERone (PROVERA) 10 MG tablet Take 1 tablet (10 mg total) by mouth daily. X 10days every 3 months 10 tablet 3  . tiZANidine (ZANAFLEX) 4 MG tablet Take 4 mg by mouth every 8 (eight) hours as needed.    Marland Kitchen albuterol (PROVENTIL) (5 MG/ML) 0.5% nebulizer solution Inhale 0.5 mLs into the lungs every 4 (four) hours as needed for wheezing. (Patient not taking: Reported on 08/13/2020)    . cyclobenzaprine (FLEXERIL) 10 MG tablet Take 1 tablet (10 mg total) by mouth at bedtime. To relax muscles 30 tablet 2  . omeprazole (PRILOSEC) 20 MG capsule Take 1 capsule (20 mg total) by mouth daily. 90 capsule 3   No facility-administered medications prior to visit.    Allergies  Allergen Reactions  . Other     CATS    Review of Systems  Constitutional: Negative.   HENT: Negative.   Respiratory: Negative.   Cardiovascular: Negative.   Gastrointestinal: Positive for abdominal pain, diarrhea and nausea.  Skin: Negative for color change and rash.  All other systems reviewed and are negative.      Objective:    Physical Exam Vitals and nursing note reviewed.  Constitutional:      Appearance: Normal appearance.     Interventions: Face mask in place.  HENT:     Head: Normocephalic.     Nose: Nose normal.  Eyes:     Conjunctiva/sclera: Conjunctivae normal.  Cardiovascular:     Rate and Rhythm: Normal rate and regular rhythm.     Pulses: Normal pulses.     Heart sounds: Normal heart sounds.  Pulmonary:     Effort: Pulmonary effort is normal.     Breath sounds: Normal breath sounds.  Abdominal:     General: Bowel sounds are  normal.     Tenderness: There is abdominal tenderness in the epigastric area. There is no right CVA tenderness, left CVA tenderness, guarding or rebound.  Skin:    Findings: No rash.  Neurological:     Mental Status: FRAIDY MCCARRICK is alert and oriented to person, place, and time.  Psychiatric:        Behavior: Behavior is cooperative.     BP 125/80   Pulse 75   Temp 97.8 F (36.6 C) (Temporal)   Ht 5\' 4"  (1.626 m)   Wt 295 lb (133.8 kg)   SpO2 97%   BMI 50.64 kg/m  Wt Readings from Last  3 Encounters:  08/13/20 295 lb (133.8 kg)  07/09/20 293 lb (132.9 kg)  05/20/20 286 lb 9.6 oz (130 kg)    Health Maintenance Due  Topic Date Due  . HIV Screening  Never done  . Hepatitis C Screening  Never done  . COVID-19 Vaccine (2 - Moderna 3-dose series) 07/16/2019    There are no preventive care reminders to display for this patient.   Lab Results  Component Value Date   TSH 3.760 06/18/2019   Lab Results  Component Value Date   WBC 9.0 10/22/2018   HGB 10.7 (L) 10/22/2018   HCT 34.6 (L) 10/22/2018   MCV 88.3 10/22/2018   PLT 330 10/22/2018   Lab Results  Component Value Date   NA 140 10/22/2018   K 3.7 10/22/2018   CO2 23 10/22/2018   GLUCOSE 114 (H) 10/22/2018   BUN 6 10/22/2018   CREATININE 0.69 10/22/2018   BILITOT 0.6 10/22/2018   ALKPHOS 119 10/22/2018   AST 343 (H) 10/22/2018   ALT 611 (H) 10/22/2018   PROT 6.0 (L) 10/22/2018   ALBUMIN 3.2 (L) 10/22/2018   CALCIUM 8.7 (L) 10/22/2018   ANIONGAP 11 10/22/2018   Lab Results  Component Value Date   CHOL 135 03/11/2016   Lab Results  Component Value Date   HDL 47 03/11/2016   Lab Results  Component Value Date   LDLCALC 71 03/11/2016   Lab Results  Component Value Date   TRIG 85 03/11/2016   Lab Results  Component Value Date   CHOLHDL 2.9 03/11/2016   Lab Results  Component Value Date   HGBA1C 5.6 06/18/2019       Assessment & Plan:   Problem List Items Addressed This Visit       Digestive   Gastroesophageal reflux disease without esophagitis - Primary    Started patient on Prilosec 40 mg tablet by mouth daily.  Education provided to patient with printed handouts given.  Advised to reduce spicy foods.  Wait at least 2 to 3 hours after meals before going to sleep.  Rx sent to pharmacy.  Patient verbalized understanding      Relevant Medications   ondansetron (ZOFRAN) 4 MG tablet   psyllium (METAMUCIL) 0.52 g capsule   omeprazole (PRILOSEC) 40 MG capsule     Other   Epigastric pain    Epigastric pain is worsening in the last few weeks.  Patient is not taking any medication currently for GERD.  No vomiting but slight nausea.  Zofran 4 mg tablet by mouth every 8 hours for nausea.  Rx sent to pharmacy.  Follow-up with worsening unresolved symptoms.      Loose stools    Patient describes loose stools as mushy, no blood or mucus, and now watery.  Patient has 2-4 bowel movement in a day.  Encourage patient to increase fiber in her diet to bulk up her stools. Imodium ordered as needed for diarrhea and loose stool. Patient verbalized understanding. Rx sent to pharmacy.  Follow-up with worsening unresolved symptoms.      Relevant Medications   psyllium (METAMUCIL) 0.52 g capsule   Nausea    Zofran 4 mg tablet by mouth as needed every 8 hours for nausea.      Relevant Medications   ondansetron (ZOFRAN) 4 MG tablet       Meds ordered this encounter  Medications  . ondansetron (ZOFRAN) 4 MG tablet    Sig: Take 1 tablet (4 mg total) by mouth every  8 (eight) hours as needed for nausea or vomiting.    Dispense:  20 tablet    Refill:  0    Order Specific Question:   Supervising Provider    Answer:   Raliegh Ip [[id number]]  . psyllium (METAMUCIL) 0.52 g capsule    Sig: Take 1 capsule (0.52 g total) by mouth daily.    Dispense:  30 capsule    Refill:  0    Order Specific Question:   Supervising Provider    Answer:   Raliegh Ip [[id number]]  .  omeprazole (PRILOSEC) 40 MG capsule    Sig: Take 1 capsule (40 mg total) by mouth daily.    Dispense:  30 capsule    Refill:  3    Order Specific Question:   Supervising Provider    Answer:   Raliegh Ip [[id number]]     Daryll Drown, NP

## 2020-08-13 NOTE — Assessment & Plan Note (Signed)
Patient describes loose stools as mushy, no blood or mucus, and now watery.  Patient has 2-4 bowel movement in a day.  Encourage patient to increase fiber in her diet to bulk up her stools. Imodium ordered as needed for diarrhea and loose stool. Patient verbalized understanding. Rx sent to pharmacy.  Follow-up with worsening unresolved symptoms.

## 2020-08-14 ENCOUNTER — Ambulatory Visit: Payer: 59 | Attending: Family Medicine | Admitting: Physical Therapy

## 2020-08-14 DIAGNOSIS — M5441 Lumbago with sciatica, right side: Secondary | ICD-10-CM | POA: Diagnosis present

## 2020-08-14 DIAGNOSIS — G8929 Other chronic pain: Secondary | ICD-10-CM | POA: Diagnosis present

## 2020-08-14 DIAGNOSIS — M5442 Lumbago with sciatica, left side: Secondary | ICD-10-CM | POA: Diagnosis present

## 2020-08-14 DIAGNOSIS — R293 Abnormal posture: Secondary | ICD-10-CM

## 2020-08-14 NOTE — Therapy (Signed)
[facility] [address] [place], [place], [address] Phone: [phone]   Fax:  [phone]  Physical Therapy Treatment  Patient Details  Name: Frances Clark MRN: [id number] Date of Birth: 08-Nov-2000 Referring Provider (PT): Deliah Boston   Encounter Date: 08/14/2020   PT End of Session - 08/14/20 0741    Visit Number 2    Number of Visits 12    Date for PT Re-Evaluation 09/09/20    PT Start Time 0736    PT Stop Time 0818    PT Time Calculation (min) 42 min    Activity Tolerance Patient tolerated treatment well    Behavior During Therapy [facility] for tasks assessed/performed           Past Medical History:  Diagnosis Date  . ADHD (attention deficit hyperactivity disorder)   . Anxiety   . Asthma   . Bipolar 2 disorder (HCC)   . Depression   . Polycystic ovarian syndrome     Past Surgical History:  Procedure Laterality Date  . CHOLECYSTECTOMY N/A 10/20/2018   Procedure: LAPAROSCOPIC CHOLECYSTECTOMY;  Surgeon: Axel Filler, MD;  Location: [facility] OR;  Service: General;  Laterality: N/A;  . ENDOSCOPIC RETROGRADE CHOLANGIOPANCREATOGRAPHY (ERCP) WITH PROPOFOL N/A 10/21/2018   Procedure: ENDOSCOPIC RETROGRADE CHOLANGIOPANCREATOGRAPHY (ERCP) WITH PROPOFOL;  Surgeon: Vida Rigger, MD;  Location: [facility] ENDOSCOPY;  Service: Endoscopy;  Laterality: N/A;  . REMOVAL OF STONES  10/21/2018   Procedure: REMOVAL OF STONES;  Surgeon: Vida Rigger, MD;  Location: [facility] ENDOSCOPY;  Service: Endoscopy;;  . Dennison Mascot  10/21/2018   Procedure: Dennison Mascot;  Surgeon: Vida Rigger, MD;  Location: [facility] ENDOSCOPY;  Service: Endoscopy;;  . WISDOM TOOTH EXTRACTION      There were no vitals filed for this visit.   Subjective Assessment - 08/14/20 0738    Subjective COVID-19 screen performed prior to patient entering clinic.  Patient arrived with no pain, yet can have flare ups at any time with certain movements    Pertinent History ADHD, Anxiety, Asthma, Bipolar  Disorder 2, Depression    Limitations Sitting;Lifting;Walking;Standing;House hold activities    How long can you sit comfortably? "few hours"    How long can you stand comfortably? "5-10 minutes"    How long can you walk comfortably? "<10 minutes"    Diagnostic tests x-ray: normal results    Patient Stated Goals return to PLOF    Currently in Pain? No/denies    Pain Location Back    Pain Orientation Right;Left;Lower   more pain on right when it hurts   Pain Onset More than a month ago    Pain Frequency Intermittent    Aggravating Factors  certain movements    Pain Relieving Factors at rest                             [facility] Adult PT Treatment/Exercise - 08/14/20 0001      Self-Care   Self-Care ADL's;Lifting;Posture;Other Self-Care Comments    Other Self-Care Comments  HEP provided for all above      Exercises   Exercises Lumbar      Lumbar Exercises: Stretches   Single Knee to Chest Stretch Right;Left;3 reps;20 seconds    Piriformis Stretch Right;Left;2 reps;20 seconds      Lumbar Exercises: Aerobic   Nustep L3 x37min UE/LE monitored      Lumbar Exercises: Standing   Other Standing Lumbar Exercises standing core sets with red swiss ball 5sec hold x20  reps    Other Standing Lumbar Exercises latt pull w red band with bracing x20      Lumbar Exercises: Seated   Other Seated Lumbar Exercises seated abdominal bracing with glut sets 5sec x20 reps, with heel lifts 2x10, marching 2x10      Lumbar Exercises: Supine   Clam 3 seconds   2x10   Bent Knee Raise 3 seconds   2x10 with bracing   Bridge 15 reps    Straight Leg Raise 3 seconds   2x10                 PT Education - 08/14/20 0809    Education Details HEP progression and posture awareness techniques    Person(s) Educated Patient    Methods Explanation;Demonstration;Handout    Comprehension Verbalized understanding;Returned demonstration               PT Long Term Goals - 08/14/20 0742       PT LONG TERM GOAL #1   Title Patient will be independent with advanced HEP    Baseline no knowledge of HEP    Time 6    Period Weeks    Status On-going      PT LONG TERM GOAL #2   Title Patient will report ability to walk around community for 20 mins or greater with low back pain less than or equal to 3/10    Baseline able to yet pain increases up to 4/10 and at times above pending the day activity 08/14/20    Time 6    Period Weeks    Status On-going      PT LONG TERM GOAL #3   Title Patient will report a centralization of neurological symptoms to low back or report no neurological symptoms to indicate no nerve irritation.    Baseline ongoing bilateral neurological symptoms to bilateral anterior thighs 08/14/20    Time 6    Period Weeks    Status On-going      PT LONG TERM GOAL #4   Title Patient will report ability to perform ADLs with low back pain less than or equal to 3/10.    Baseline 4-7/10 pending activity 08/14/20    Time 6    Period Weeks    Status On-going                 Plan - 08/14/20 2778    Clinical Impression Statement Patient tolerated treatment well today. Patient was educated on abdominal bracing with progression and posture awarness techniques in all positions to avoid flare ups. Patient reported no pain today and able to progress with supine, sitting and standing activities. Patient has pain with certain movements and prolong activity can flare up symptoms. Patient reported 4-7/10 pain with flare ups bil low back with more on right side. Current goals progressing due to pain limitations.    Personal Factors and Comorbidities Comorbidity 1    Comorbidities ADHD, Anxiety, Asthma, Bipolar Disorder 2, Depression    Examination-Activity Limitations Other;Stand;Locomotion Level    Examination-Participation Restrictions Other    Stability/Clinical Decision Making Stable/Uncomplicated    Rehab Potential Good    PT Frequency 2x / week    PT Duration 6 weeks     PT Treatment/Interventions ADLs/Self Care Home Management;Ultrasound;Traction;Moist Heat;Therapeutic activities;Therapeutic exercise;Balance training;Neuromuscular re-education;Manual techniques;Passive range of motion;Patient/family education    PT Next Visit Plan cont with POC for Nustep, core stabilization and progression as tolerated    Consulted and Agree with Plan of Care  Patient           Patient will benefit from skilled therapeutic intervention in order to improve the following deficits and impairments:  Pain,Postural dysfunction,Decreased activity tolerance  Visit Diagnosis: Chronic bilateral low back pain with bilateral sciatica  Abnormal posture     Problem List Patient Active Problem List   Diagnosis Date Noted  . Epigastric pain 08/13/2020  . Gastroesophageal reflux disease without esophagitis 08/13/2020  . Loose stools 08/13/2020  . Nausea 08/13/2020  . 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 Armpit abscess 10/25/2019  . Axillary hidradenitis suppurativa 09/25/2019  . Cellulitis of external ear, left 09/25/2019  . Hypersomnolence 09/03/2019  . Mild intermittent asthma without complication 02/14/2017  . Bipolar depression (HCC) 02/14/2017  . Severe major depression (HCC) 03/10/2016    Renia Mikelson P, PTA 08/14/2020, 8:30 AM  [facility] [address] [place], [place], [address] Phone: [phone]   Fax:  [phone]  Name: Frances Clark MRN: [id number] Date of Birth: Jun 24, 2000

## 2020-08-14 NOTE — Patient Instructions (Signed)
Pelvic Tilt: Posterior - Legs Bent (Supine)  Tighten stomach and buttock and flatten back by rolling pelvis down. Hold _10___ seconds. Relax. Repeat _10-30___ times per set. Do __2__ sets per session. Do _2___ sessions per day.   Bent Leg Lift (Hook-Lying)  Tighten stomach and buttock and slowly raise right leg _5___ inches from floor. Keep trunk rigid. Hold _3___ seconds. Repeat _10___ times per set. Do ___2-3_ sets per session. Do __2__ sessions per day.   Heel Raise (Sitting)    Raise heels, keeping toes on floor.  Tighten abdomin and buttock (draw in) Repeat __10__ times per set. Do __1-2__ sets per session. Do __1-2__ sessions per day.   FLEXION: Sitting (Active)    Sit, both feet flat. Lift right knee toward ceiling. Tighten abdomin and buttock (draw in) Complete _10__ sets of _1-2__ repetitions. Perform _1-2__ sessions per day.  Brushing Teeth    Place one foot on ledge and one hand on counter. Bend other knee slightly to keep back straight.  Copyright  VHI. All rights reserved.  Refrigerator   Squat with knees apart to reach lower shelves and drawers.   Copyright  VHI. All rights reserved.  Laundry Basket   Squat down and hold basket close to stand. Use leg muscles to do the work.   Copyright  VHI. All rights reserved.  Housework - Vacuuming   Hold the vacuum with arm held at side. Step back and forth to move it, keeping head up. Avoid twisting.   Copyright  VHI. All rights reserved.  Housework - Wiping   Position yourself as close as possible to reach work surface. Avoid straining your back.   Copyright  VHI. All rights reserved.  Gardening - Mowing   Keep arms close to sides and walk with lawn mower.   Copyright  VHI. All rights reserved.  Sleeping on Side   Place pillow between knees. Use cervical support under neck and a roll around waist as needed.   Copyright  VHI. All rights reserved.  Log Roll   Lying on back, bend  left knee and place left arm across chest. Roll all in one movement to the right. Reverse to roll to the left. Always move as one unit.   Copyright  VHI. All rights reserved.  Stand to Sit / Sit to Stand   To sit: Bend knees to lower self onto front edge of chair, then scoot back on seat. To stand: Reverse sequence by placing one foot forward, and scoot to front of seat. Use rocking motion to stand up.  Copyright  VHI. All rights reserved.  Posture - Standing   Good posture is important. Avoid slouching and forward head thrust. Maintain curve in low back and align ears over shoul- ders, hips over ankles.   Copyright  VHI. All rights reserved.  Posture - Sitting   Sit upright, head facing forward. Try using a roll to support lower back. Keep shoulders relaxed, and avoid rounded back. Keep hips level with knees. Avoid crossing legs for long periods.   Copyright  VHI. All rights reserved.  Computer Work   Position work to face forward. Use proper work and seat height. Keep shoulders back and down, wrists straight, and elbows at right angles. Use chair that provides full back support. Add footrest and lumbar roll as needed.   Copyright  VHI. All rights reserved.    

## 2020-08-22 ENCOUNTER — Ambulatory Visit: Payer: 59 | Admitting: Physical Therapy

## 2020-08-22 ENCOUNTER — Other Ambulatory Visit: Payer: Self-pay

## 2020-08-22 ENCOUNTER — Encounter: Payer: Self-pay | Admitting: Physical Therapy

## 2020-08-22 DIAGNOSIS — G8929 Other chronic pain: Secondary | ICD-10-CM

## 2020-08-22 DIAGNOSIS — M5442 Lumbago with sciatica, left side: Secondary | ICD-10-CM

## 2020-08-22 DIAGNOSIS — R293 Abnormal posture: Secondary | ICD-10-CM

## 2020-08-22 NOTE — Therapy (Signed)
[facility] [address] [place], [place], [address] Phone: [phone]   Fax:  [phone]  Physical Therapy Treatment  Patient Details  Name: Frances Clark MRN: [id number] Date of Birth: September 20, 2000 Referring Provider (PT): Deliah Boston   Encounter Date: 08/22/2020   PT End of Session - 08/22/20 0730     Visit Number 3    Number of Visits 12    Date for PT Re-Evaluation 09/09/20    PT Start Time 0734    PT Stop Time 0814    PT Time Calculation (min) 40 min    Activity Tolerance Patient tolerated treatment well    Behavior During Therapy [facility] for tasks assessed/performed             Past Medical History:  Diagnosis Date   ADHD (attention deficit hyperactivity disorder)    Anxiety    Asthma    Bipolar 2 disorder (HCC)    Depression    Polycystic ovarian syndrome     Past Surgical History:  Procedure Laterality Date   CHOLECYSTECTOMY N/A 10/20/2018   Procedure: LAPAROSCOPIC CHOLECYSTECTOMY;  Surgeon: Axel Filler, MD;  Location: [facility] OR;  Service: General;  Laterality: N/A;   ENDOSCOPIC RETROGRADE CHOLANGIOPANCREATOGRAPHY (ERCP) WITH PROPOFOL N/A 10/21/2018   Procedure: ENDOSCOPIC RETROGRADE CHOLANGIOPANCREATOGRAPHY (ERCP) WITH PROPOFOL;  Surgeon: Vida Rigger, MD;  Location: [facility] ENDOSCOPY;  Service: Endoscopy;  Laterality: N/A;   REMOVAL OF STONES  10/21/2018   Procedure: REMOVAL OF STONES;  Surgeon: Vida Rigger, MD;  Location: [facility] ENDOSCOPY;  Service: Endoscopy;;   SPHINCTEROTOMY  10/21/2018   Procedure: Dennison Mascot;  Surgeon: Vida Rigger, MD;  Location: [facility] ENDOSCOPY;  Service: Endoscopy;;   WISDOM TOOTH EXTRACTION      There were no vitals filed for this visit.   Subjective Assessment - 08/22/20 0729     Subjective COVID-19 screen performed prior to patient entering clinic. Reports occasional back pain while at work or throughout the day.    Pertinent History ADHD, Anxiety, Asthma, Bipolar Disorder 2, Depression     Limitations Sitting;Lifting;Walking;Standing;House hold activities    How long can you sit comfortably? "few hours"    How long can you stand comfortably? "5-10 minutes"    How long can you walk comfortably? "<10 minutes"    Diagnostic tests x-ray: normal results    Patient Stated Goals return to PLOF    Currently in Pain? No/denies                [facility] PT Assessment - 08/22/20 0001       Assessment   Medical Diagnosis Chronic midline LBP with bilateral sciatica.    Referring Provider (PT) Deliah Boston      Precautions   Precautions None                           [facility] Adult PT Treatment/Exercise - 08/22/20 0001       Exercises   Exercises Lumbar;Knee/Hip      Lumbar Exercises: Aerobic   Nustep L3 x30min UE/LE monitored      Lumbar Exercises: Standing   Shoulder Extension Strengthening;Both;20 reps    Shoulder Extension Limitations yellow theraband    Other Standing Lumbar Exercises standing BUE D2 yellow theraband x15 reps; core press with LE press PB x10 rpes    Other Standing Lumbar Exercises wall pushups x20 reps      Lumbar Exercises: Supine   Clam 20 reps;Limitations    Clam Limitations  yellow theraband    Bent Knee Raise 20 reps    Bent Knee Raise Limitations yellow theraband    Bridge 20 reps;3 seconds      Knee/Hip Exercises: Standing   Hip Abduction AROM;Both;15 reps;Knee straight    Hip Extension AROM;Both;15 reps;Knee bent                         PT Long Term Goals - 08/14/20 0742       PT LONG TERM GOAL #1   Title Patient will be independent with advanced HEP    Baseline no knowledge of HEP    Time 6    Period Weeks    Status On-going      PT LONG TERM GOAL #2   Title Patient will report ability to walk around community for 20 mins or greater with low back pain less than or equal to 3/10    Baseline able to yet pain increases up to 4/10 and at times above pending the day activity 08/14/20    Time 6     Period Weeks    Status On-going      PT LONG TERM GOAL #3   Title Patient will report a centralization of neurological symptoms to low back or report no neurological symptoms to indicate no nerve irritation.    Baseline ongoing bilateral neurological symptoms to bilateral anterior thighs 08/14/20    Time 6    Period Weeks    Status On-going      PT LONG TERM GOAL #4   Title Patient will report ability to perform ADLs with low back pain less than or equal to 3/10.    Baseline 4-7/10 pending activity 08/14/20    Time 6    Period Weeks    Status On-going                   Plan - 08/22/20 9323     Clinical Impression Statement Patient presented in clinic with reports of no current LBP. Patient does indicate that she has some radicular pain in LEs R > L. Patient progressed through postural as well as core/lumbar strengthening. Patient did report some discomfort with bridging in low back. No other compaints of pain reported by patient during therex. Compliant and independent with HEP.    Personal Factors and Comorbidities Comorbidity 1    Comorbidities ADHD, Anxiety, Asthma, Bipolar Disorder 2, Depression    Examination-Activity Limitations Other;Stand;Locomotion Level    Examination-Participation Restrictions Other    Stability/Clinical Decision Making Stable/Uncomplicated    Rehab Potential Good    PT Frequency 2x / week    PT Duration 6 weeks    PT Treatment/Interventions ADLs/Self Care Home Management;Ultrasound;Traction;Moist Heat;Therapeutic activities;Therapeutic exercise;Balance training;Neuromuscular re-education;Manual techniques;Passive range of motion;Patient/family education    PT Next Visit Plan cont with POC for Nustep, core stabilization and progression as tolerated    Consulted and Agree with Plan of Care Patient             Patient will benefit from skilled therapeutic intervention in order to improve the following deficits and impairments:  Pain, Postural  dysfunction, Decreased activity tolerance  Visit Diagnosis: Chronic bilateral low back pain with bilateral sciatica  Abnormal posture     Problem List Patient Active Problem List   Diagnosis Date Noted   Epigastric pain 08/13/2020   Gastroesophageal reflux disease without esophagitis 08/13/2020   Loose stools 08/13/2020   Nausea 08/13/2020   Nickel allergy 10/25/2019  Armpit abscess 10/25/2019   Axillary hidradenitis suppurativa 09/25/2019   Cellulitis of external ear, left 09/25/2019   Hypersomnolence 09/03/2019   Mild intermittent asthma without complication 02/14/2017   Bipolar depression (HCC) 02/14/2017   Severe major depression (HCC) 03/10/2016    Marvell Fuller, PTA 08/22/2020, 8:26 AM  [facility] [address] [place], [place], [address] Phone: [phone]   Fax:  [phone]  Name: Frances Clark MRN: [id number] Date of Birth: 10/12/2000

## 2020-08-27 ENCOUNTER — Ambulatory Visit: Payer: 59 | Admitting: Physical Therapy

## 2020-08-27 ENCOUNTER — Other Ambulatory Visit: Payer: Self-pay

## 2020-08-27 DIAGNOSIS — M5442 Lumbago with sciatica, left side: Secondary | ICD-10-CM | POA: Diagnosis not present

## 2020-08-27 DIAGNOSIS — G8929 Other chronic pain: Secondary | ICD-10-CM

## 2020-08-27 DIAGNOSIS — R293 Abnormal posture: Secondary | ICD-10-CM

## 2020-08-27 NOTE — Therapy (Signed)
[facility] [place], [place], [address] Phone: [phone]   Fax:  [phone]  Physical Therapy Treatment  Patient Details  Name: Frances Clark MRN: [id number] Date of Birth: [birth year] Referring Provider (PT): Hendricks Limes   Encounter Date: 08/27/2020   PT End of Session - 08/27/20 0750     Visit Number 4    Number of Visits 12    Date for PT Re-Evaluation 09/09/20    PT Start Time 0730    PT Stop Time 0813    PT Time Calculation (min) 43 min    Activity Tolerance Patient tolerated treatment well    Behavior During Therapy [facility] for tasks assessed/performed             Past Medical History:  Diagnosis Date   ADHD (attention deficit hyperactivity disorder)    Anxiety    Asthma    Bipolar 2 disorder ([place])    Depression    Polycystic ovarian syndrome     Past Surgical History:  Procedure Laterality Date   CHOLECYSTECTOMY N/A 10/20/2018   Procedure: LAPAROSCOPIC CHOLECYSTECTOMY;  Surgeon: Ralene Ok, MD;  Location: [facility];  Service: General;  Laterality: N/A;   ENDOSCOPIC RETROGRADE CHOLANGIOPANCREATOGRAPHY (ERCP) WITH PROPOFOL N/A 10/21/2018   Procedure: ENDOSCOPIC RETROGRADE CHOLANGIOPANCREATOGRAPHY (ERCP) WITH PROPOFOL;  Surgeon: Clarene Essex, MD;  Location: [place];  Service: Endoscopy;  Laterality: N/A;   REMOVAL OF STONES  10/21/2018   Procedure: REMOVAL OF STONES;  Surgeon: Clarene Essex, MD;  Location: [facility] ENDOSCOPY;  Service: Endoscopy;;   SPHINCTEROTOMY  10/21/2018   Procedure: Joan Mayans;  Surgeon: Clarene Essex, MD;  Location: [place];  Service: Endoscopy;;   WISDOM TOOTH EXTRACTION      There were no vitals filed for this visit.   Subjective Assessment - 08/27/20 0743     Subjective COVID-19 screen performed prior to patient entering clinic. Patient reported some ongoing discomfort. Unsure of overall progress.    Pertinent History ADHD, Anxiety, Asthma, Bipolar Disorder 2, Depression     Limitations Sitting;Lifting;Walking;Standing;House hold activities    How long can you sit comfortably? "few hours"    How long can you stand comfortably? "5-10 minutes"    How long can you walk comfortably? "<10 minutes"    Diagnostic tests x-ray: normal results    Patient Stated Goals return to PLOF    Currently in Pain? Yes    Pain Score 3     Pain Location Back    Pain Orientation Left;Lower    Pain Descriptors / Indicators Discomfort    Pain Radiating Towards left thigh    Pain Onset More than a month ago    Pain Frequency Intermittent    Aggravating Factors  certain movements    Pain Relieving Factors rest                               [facility] Adult PT Treatment/Exercise - 08/27/20 0001       Lumbar Exercises: Stretches   Single Knee to Chest Stretch Right;Left;3 reps;20 seconds    Piriformis Stretch Right;Left;20 seconds;3 reps      Lumbar Exercises: Aerobic   Nustep L4 x56mn UE/LE monitored      Lumbar Exercises: Standing   Row Strengthening;Both;20 reps    Row Limitations green XTS    Shoulder Extension Strengthening;Both;20 reps    Shoulder Extension Limitations green XTS    Other Standing Lumbar Exercises green XTS for  diagnols 2x10    Other Standing Lumbar Exercises wall pushups x20 reps      Lumbar Exercises: Supine   Bent Knee Raise 3 seconds   2x10   Bridge with clamshell 20 reps    Bridge with [employer] Limitations red band    Straight Leg Raise 3 seconds   2x10   Other Supine Lumbar Exercises ball squeeze 10sec x20reps      Knee/Hip Exercises: Standing   Hip Abduction Stengthening;Both;2 sets;10 reps;Knee straight    Abduction Limitations yellow band                    PT Education - 08/27/20 0812     Education Details HEP progression with band    Person(s) Educated Patient    Methods Explanation;Demonstration;Handout    Comprehension Verbalized understanding;Returned demonstration                 PT  Long Term Goals - 08/27/20 0751       PT LONG TERM GOAL #1   Title Patient will be independent with advanced HEP    Baseline issued and reviewed 08/27/20    Time 6    Period Weeks    Status Achieved      PT LONG TERM GOAL #2   Title Patient will report ability to walk around community for 20 mins or greater with low back pain less than or equal to 3/10    Baseline Pain level above 3/10 at this time with prolong walking per reported 08/27/20    Time 6    Period Weeks    Status On-going      PT LONG TERM GOAL #3   Title Patient will report a centralization of neurological symptoms to low back or report no neurological symptoms to indicate no nerve irritation.    Baseline ongoing bilateral neurological symptoms to bilateral anterior thighs 08/27/20    Time 6    Period Weeks    Status On-going      PT LONG TERM GOAL #4   Title Patient will report ability to perform ADLs with low back pain less than or equal to 3/10.    Baseline above 3/10 per reported with ADL's 08/27/20    Time 6    Period Weeks    Status On-going                   Plan - 08/27/20 0806     Clinical Impression Statement Patient tolerated treatment well today. Patient able to progress with activity tolernce and ther ex to improve mobility and strength. Patient reported ongoing pain and radicular symptoms bil ant thigh. Patients pain is over a 3/10 with ADL's, prolong walking and stnding actvity per reported. Patient met LTG #1 with other goals ongoing due to pain limitations.    Personal Factors and Comorbidities Comorbidity 1    Comorbidities ADHD, Anxiety, Asthma, Bipolar Disorder 2, Depression    Examination-Activity Limitations Other;Stand;Locomotion Level    Examination-Participation Restrictions Other    Stability/Clinical Decision Making Stable/Uncomplicated    Rehab Potential Good    PT Frequency 2x / week    PT Duration 6 weeks    PT Treatment/Interventions ADLs/Self Care Home  Management;Ultrasound;Traction;Moist Heat;Therapeutic activities;Therapeutic exercise;Balance training;Neuromuscular re-education;Manual techniques;Passive range of motion;Patient/family education    PT Next Visit Plan cont with POC for Nustep, core stabilization and progression as tolerated    Consulted and Agree with Plan of Care Patient  Patient will benefit from skilled therapeutic intervention in order to improve the following deficits and impairments:  Pain, Postural dysfunction, Decreased activity tolerance  Visit Diagnosis: Chronic bilateral low back pain with bilateral sciatica  Abnormal posture     Problem List Patient Active Problem List   Diagnosis Date Noted   Epigastric pain 08/13/2020   Gastroesophageal reflux disease without esophagitis 08/13/2020   Loose stools 08/13/2020   Nausea 08/13/2020   Nickel allergy 10/25/2019   Armpit abscess 10/25/2019   Axillary hidradenitis suppurativa 09/25/2019   Cellulitis of external ear, left 09/25/2019   Hypersomnolence 09/03/2019   Mild intermittent asthma without complication 35/52/1747   Bipolar depression ([place]) 02/14/2017   Severe major depression ([place]) 03/10/2016    Tieasha Larsen P, PTA 08/27/2020, 8:15 AM  [facility] [address] [place], [place], [address] Phone: [phone]   Fax:  [phone]  Name: RAYVN RICKERSON MRN: [id number] Date of Birth: September 09, 2000

## 2020-08-27 NOTE — Patient Instructions (Signed)
°  Scapular Retraction: Bilateral ° °Facing anchor, pull arms back, bringing shoulder blades together. °Repeat _30___ times per set. Do __2-3__ sets per session. Do _2___ sessions per day. ° ° ° °Standing lat pull with theraband ° °Anchor bands higher than your head. Start with your arms straight out in front of you at shoulder height (or a little above).  °Pull bands down next to your body and then slowly return to the starting position.  °10-30 x1day ° ° ° °Bridging with Theraband ° °Begin by lying on your back with your knees bent and theraband around your knees. Tighten your abs by tilting your pelvis up and flattening your back on the mat. Squeeze your glutes and raise your hips off of the table towards the ceiling. Keeping your hips raised, pull your knees outward against the band. Relax your knees back to midline and slowly return your hips to the mat. Relax and Breathe 2x10 1x daily ° ° ° °

## 2020-08-28 ENCOUNTER — Ambulatory Visit: Payer: 59 | Admitting: Family Medicine

## 2020-09-01 DIAGNOSIS — M5431 Sciatica, right side: Secondary | ICD-10-CM | POA: Diagnosis not present

## 2020-09-01 DIAGNOSIS — N760 Acute vaginitis: Secondary | ICD-10-CM | POA: Diagnosis not present

## 2020-09-01 DIAGNOSIS — N898 Other specified noninflammatory disorders of vagina: Secondary | ICD-10-CM | POA: Diagnosis not present

## 2020-09-04 ENCOUNTER — Ambulatory Visit: Payer: 59 | Admitting: Physical Therapy

## 2020-09-12 ENCOUNTER — Ambulatory Visit: Payer: 59 | Attending: Family Medicine | Admitting: Physical Therapy

## 2020-09-12 ENCOUNTER — Encounter: Payer: Self-pay | Admitting: Physical Therapy

## 2020-09-12 ENCOUNTER — Other Ambulatory Visit: Payer: Self-pay

## 2020-09-12 DIAGNOSIS — M5441 Lumbago with sciatica, right side: Secondary | ICD-10-CM | POA: Insufficient documentation

## 2020-09-12 DIAGNOSIS — M5442 Lumbago with sciatica, left side: Secondary | ICD-10-CM | POA: Diagnosis not present

## 2020-09-12 DIAGNOSIS — R293 Abnormal posture: Secondary | ICD-10-CM | POA: Insufficient documentation

## 2020-09-12 DIAGNOSIS — G8929 Other chronic pain: Secondary | ICD-10-CM | POA: Diagnosis not present

## 2020-09-12 NOTE — Therapy (Signed)
[facility] [address] [place], [place], [address] Phone: [phone]   Fax:  [phone]  Physical Therapy Treatment  Patient Details  Name: Frances Clark MRN: [id number] Date of Birth: [birth year] Referring Provider (PT): Deliah Boston   Encounter Date: 09/12/2020   PT End of Session - 09/12/20 0749     Visit Number 5    Number of Visits 12    Date for PT Re-Evaluation 09/09/20    PT Start Time 0747    PT Stop Time 0817   late arrival   PT Time Calculation (min) 30 min    Activity Tolerance Patient tolerated treatment well    Behavior During Therapy [facility] for tasks assessed/performed             Past Medical History:  Diagnosis Date   ADHD (attention deficit hyperactivity disorder)    Anxiety    Asthma    Bipolar 2 disorder ([facility])    Depression    Polycystic ovarian syndrome     Past Surgical History:  Procedure Laterality Date   CHOLECYSTECTOMY N/A 10/20/2018   Procedure: LAPAROSCOPIC CHOLECYSTECTOMY;  Surgeon: Axel Filler, MD;  Location: [facility];  Service: General;  Laterality: N/A;   ENDOSCOPIC RETROGRADE CHOLANGIOPANCREATOGRAPHY (ERCP) WITH PROPOFOL N/A 10/21/2018   Procedure: ENDOSCOPIC RETROGRADE CHOLANGIOPANCREATOGRAPHY (ERCP) WITH PROPOFOL;  Surgeon: Vida Rigger, MD;  Location[facility] ENDOSCOPY;  Service: Endoscopy;  Laterality: N/A;   REMOVAL OF STONES  10/21/2018   Procedure: REMOVAL OF STONES;  Surgeon: Vida Rigger, MD;  Location: [facility] ENDOSCOPY;  Service: Endoscopy;;   SPHINCTEROTOMY  10/21/2018   Procedure: Dennison Mascot;  Surgeon: Vida Rigger, MD;  Location: [facility] ENDOSCOPY;  Service: Endoscopy;;   WISDOM TOOTH EXTRACTION      There were no vitals filed for this visit.   Subjective Assessment - 09/12/20 0747     Subjective COVID-19 screen performed prior to patient entering clinic. Reports that since last week, LBP has increased and had to use muscle relaxers.    Pertinent History ADHD, Anxiety, Asthma, Bipolar  Disorder 2, Depression    Limitations Sitting;Lifting;Walking;Standing;House hold activities    How long can you sit comfortably? "few hours"    How long can you stand comfortably? "5-10 minutes"    How long can you walk comfortably? "<10 minutes"    Diagnostic tests x-ray: normal results    Patient Stated Goals return to PLOF    Currently in Pain? Yes    Pain Score 6     Pain Location Back    Pain Orientation Left;Lower    Pain Descriptors / Indicators Discomfort    Pain Type Chronic pain    Pain Onset More than a month ago    Pain Frequency Intermittent                [facility] PT Assessment - 09/12/20 0001       Assessment   Medical Diagnosis Chronic midline LBP with bilateral sciatica.    Referring Provider (PT) Deliah Boston      Precautions   Precautions None                           [facility] Adult PT Treatment/Exercise - 09/12/20 0001       Lumbar Exercises: Stretches   Other Lumbar Stretch Exercise Seated forward rolls x10 reps, lateral rolls x5 reps    Other Lumbar Stretch Exercise Seated RLE sciatic nerve glide x5 reps  Lumbar Exercises: Aerobic   Nustep L3 x68min UE/LE monitored      Lumbar Exercises: Machines for Strengthening   Cybex Lumbar Extension 60# 2x10 reps      Lumbar Exercises: Standing   Lifting From 12"    Lifting Limitations with green PB    Shoulder Extension Strengthening;Both;20 reps    Shoulder Extension Limitations green XTS    Other Standing Lumbar Exercises wall pushups x20 reps                         PT Long Term Goals - 08/27/20 0751       PT LONG TERM GOAL #1   Title Patient will be independent with advanced HEP    Baseline issued and reviewed 08/27/20    Time 6    Period Weeks    Status Achieved      PT LONG TERM GOAL #2   Title Patient will report ability to walk around community for 20 mins or greater with low back pain less than or equal to 3/10    Baseline Pain level above 3/10 at  this time with prolong walking per reported 08/27/20    Time 6    Period Weeks    Status On-going      PT LONG TERM GOAL #3   Title Patient will report a centralization of neurological symptoms to low back or report no neurological symptoms to indicate no nerve irritation.    Baseline ongoing bilateral neurological symptoms to bilateral anterior thighs 08/27/20    Time 6    Period Weeks    Status On-going      PT LONG TERM GOAL #4   Title Patient will report ability to perform ADLs with low back pain less than or equal to 3/10.    Baseline above 3/10 per reported with ADL's 08/27/20    Time 6    Period Weeks    Status On-going                   Plan - 09/12/20 1610     Clinical Impression Statement Patient presented in clinic with increased LBP that has been present over the last week. Patient progressed through a lighter treatment in order to avoid further pain and limit aggravations. Patient reported some sciatic symptoms with seated stretching. Patient has recently been prescribed steriods but has finished them prior to today's treatment. Patient denied any worse symptoms following today's treatment.    Personal Factors and Comorbidities Comorbidity 1    Comorbidities ADHD, Anxiety, Asthma, Bipolar Disorder 2, Depression    Examination-Activity Limitations Other;Stand;Locomotion Level    Examination-Participation Restrictions Other    Stability/Clinical Decision Making Stable/Uncomplicated    Rehab Potential Good    PT Frequency 2x / week    PT Duration 6 weeks    PT Treatment/Interventions ADLs/Self Care Home Management;Ultrasound;Traction;Moist Heat;Therapeutic activities;Therapeutic exercise;Balance training;Neuromuscular re-education;Manual techniques;Passive range of motion;Patient/family education    PT Next Visit Plan cont with POC for Nustep, core stabilization and progression as tolerated    Consulted and Agree with Plan of Care Patient             Patient  will benefit from skilled therapeutic intervention in order to improve the following deficits and impairments:  Pain, Postural dysfunction, Decreased activity tolerance  Visit Diagnosis: Chronic bilateral low back pain with bilateral sciatica  Abnormal posture     Problem List Patient Active Problem List   Diagnosis Date Noted  Epigastric pain 08/13/2020   Gastroesophageal reflux disease without esophagitis 08/13/2020   Loose stools 08/13/2020   Nausea 08/13/2020   Nickel allergy 10/25/2019   Armpit abscess 10/25/2019   Axillary hidradenitis suppurativa 09/25/2019   Cellulitis of external ear, left 09/25/2019   Hypersomnolence 09/03/2019   Mild intermittent asthma without complication 02/14/2017   Bipolar depression (H[facility]) 02/14/2017   Severe major depression (HCC) 03/10/2016    Marvell Fuller, PTA 09/12/2020, 9:55 AM  [facility] [address] [place], [place], [address] Phone: [phone]   Fax:  [phone]  Name: Frances Clark MRN: [id number] Date of Birth: [birth year]

## 2020-09-19 ENCOUNTER — Ambulatory Visit: Payer: 59

## 2020-09-21 DIAGNOSIS — M5416 Radiculopathy, lumbar region: Secondary | ICD-10-CM | POA: Diagnosis not present

## 2020-09-25 ENCOUNTER — Ambulatory Visit: Payer: 59 | Admitting: Family Medicine

## 2020-09-25 ENCOUNTER — Encounter: Payer: 59 | Admitting: Physical Therapy

## 2020-10-01 ENCOUNTER — Ambulatory Visit: Payer: 59 | Admitting: Physical Therapy

## 2020-10-01 ENCOUNTER — Ambulatory Visit: Payer: 59 | Admitting: Family Medicine

## 2020-10-06 ENCOUNTER — Other Ambulatory Visit: Payer: Self-pay

## 2020-10-06 ENCOUNTER — Ambulatory Visit: Payer: 59 | Admitting: Physical Therapy

## 2020-10-06 DIAGNOSIS — R293 Abnormal posture: Secondary | ICD-10-CM | POA: Diagnosis not present

## 2020-10-06 DIAGNOSIS — G8929 Other chronic pain: Secondary | ICD-10-CM

## 2020-10-06 DIAGNOSIS — M5441 Lumbago with sciatica, right side: Secondary | ICD-10-CM | POA: Diagnosis not present

## 2020-10-06 DIAGNOSIS — M5442 Lumbago with sciatica, left side: Secondary | ICD-10-CM

## 2020-10-06 NOTE — Therapy (Signed)
[facility] [place], [place], [address] Phone: [phone]   Fax:  [phone]  Physical Therapy Treatment  Patient Details  Name: Frances Clark MRN: [id number] Date of Birth: Jul 15, 2000 Referring Provider (PT): Hendricks Limes   Encounter Date: 10/06/2020   PT End of Session - 10/06/20 0829     Visit Number 6    Number of Visits 12    Date for PT Re-Evaluation 09/09/20    PT Start Time 0818    PT Stop Time 5852    PT Time Calculation (min) 41 min    Activity Tolerance Patient tolerated treatment well;Patient limited by pain    Behavior During Therapy [facility] for tasks assessed/performed             Past Medical History:  Diagnosis Date   ADHD (attention deficit hyperactivity disorder)    Anxiety    Asthma    Bipolar 2 disorder ([place])    Depression    Polycystic ovarian syndrome     Past Surgical History:  Procedure Laterality Date   CHOLECYSTECTOMY N/A 10/20/2018   Procedure: LAPAROSCOPIC CHOLECYSTECTOMY;  Surgeon: Ralene Ok, MD;  Location: [place];  Service: General;  Laterality: N/A;   ENDOSCOPIC RETROGRADE CHOLANGIOPANCREATOGRAPHY (ERCP) WITH PROPOFOL N/A 10/21/2018   Procedure: ENDOSCOPIC RETROGRADE CHOLANGIOPANCREATOGRAPHY (ERCP) WITH PROPOFOL;  Surgeon: Clarene Essex, MD;  Location: [place];  Service: Endoscopy;  Laterality: N/A;   REMOVAL OF STONES  10/21/2018   Procedure: REMOVAL OF STONES;  Surgeon: Clarene Essex, MD;  Location: [facility] ENDOSCOPY;  Service: Endoscopy;;   SPHINCTEROTOMY  10/21/2018   Procedure: Joan Mayans;  Surgeon: Clarene Essex, MD;  Location: [place];  Service: Endoscopy;;   WISDOM TOOTH EXTRACTION      There were no vitals filed for this visit.   Subjective Assessment - 10/06/20 0823     Subjective COVID-19 screen performed prior to patient entering clinic. Patient arrived with some ongoing back pain and went roller skating over weekend, did not do a lot due to not knowing how  to skate yet was [place] on rink [place] attempting for about 20 min. "no falls yet almost did several times"    Pertinent History ADHD, Anxiety, Asthma, Bipolar Disorder 2, Depression    Limitations Sitting;Lifting;Walking;Standing;House hold activities    How long can you sit comfortably? "few hours"    How long can you stand comfortably? "5-10 minutes"    How long can you walk comfortably? "<10 minutes"    Diagnostic tests x-ray: normal results    Patient Stated Goals return to PLOF    Currently in Pain? Yes    Pain Score 4     Pain Location Back    Pain Orientation Left;Lower    Pain Descriptors / Indicators Discomfort    Pain Type Chronic pain    Pain Onset More than a month ago    Pain Frequency Intermittent    Aggravating Factors  certain movements and activity    Pain Relieving Factors rest                               [facility] Adult PT Treatment/Exercise - 10/06/20 0001       Lumbar Exercises: Stretches   Single Knee to Chest Stretch Right;Left;3 reps;20 seconds    Piriformis Stretch Left;Right;3 reps;30 seconds      Lumbar Exercises: Aerobic   Nustep L3 x74mn UE/LE monitored      Lumbar  Exercises: Seated   Other Seated Lumbar Exercises seated rows and latt pull with green t-band 2x10 each with abdominal bracing    Other Seated Lumbar Exercises seated core sets with red swiss ball x53min      Lumbar Exercises: Supine   Bridge with clamshell 20 reps    Bridge with Ball Squeeze Limitations red band    Other Supine Lumbar Exercises ball squeeze 10sec x20reps                         PT Long Term Goals - 10/06/20 0829       PT LONG TERM GOAL #1   Title Patient will be independent with advanced HEP    Baseline issued and reviewed 08/27/20    Time 6    Period Weeks    Status Achieved      PT LONG TERM GOAL #2   Title Patient will report ability to walk around community for 20 mins or greater with low back pain less than or equal to  3/10    Baseline 6/10 with community walking 10/06/20    Time 6    Period Weeks    Status Not Met      PT LONG TERM GOAL #3   Title Patient will report a centralization of neurological symptoms to low back or report no neurological symptoms to indicate no nerve irritation.    Baseline ongoing bilateral neurological symptoms to bilateral anterior thighs 10/06/20    Time 6    Period Weeks    Status Not Met      PT LONG TERM GOAL #4   Title Patient will report ability to perform ADLs with low back pain less than or equal to 3/10.    Baseline 4/10 to 6-7/10 per reported with ADL's 10/06/20    Time 6    Period Weeks    Status Not Met                   Plan - 10/06/20 0834     Clinical Impression Statement Patient tolerated treatment fair due to ongoing back pain and symptoms. Patient able to progress with low level abdominal bracing with core progression seated and standing activities. Patient limited with walking due to pain increases to 6-7/10 with 20 min. patient has reported no relief in symptoms thus far.  Patient remaining goals not met due to pain deficts. To MD on wednesday for f/u.    Personal Factors and Comorbidities Comorbidity 1    Comorbidities ADHD, Anxiety, Asthma, Bipolar Disorder 2, Depression    Examination-Activity Limitations Other;Stand;Locomotion Level    Examination-Participation Restrictions Other    Stability/Clinical Decision Making Stable/Uncomplicated    Rehab Potential Good    PT Frequency 2x / week    PT Duration 6 weeks    PT Treatment/Interventions ADLs/Self Care Home Management;Ultrasound;Traction;Moist Heat;Therapeutic activities;Therapeutic exercise;Balance training;Neuromuscular re-education;Manual techniques;Passive range of motion;Patient/family education    PT Next Visit Plan on hold per MD appt    Consulted and Agree with Plan of Care Patient             Patient will benefit from skilled therapeutic intervention in order to improve  the following deficits and impairments:  Pain, Postural dysfunction, Decreased activity tolerance  Visit Diagnosis: Chronic bilateral low back pain with bilateral sciatica  Abnormal posture     Problem List Patient Active Problem List   Diagnosis Date Noted   Epigastric pain 08/13/2020   Gastroesophageal  reflux disease without esophagitis 08/13/2020   Loose stools 08/13/2020   Nausea 08/13/2020   Nickel allergy 10/25/2019   Armpit abscess 10/25/2019   Axillary hidradenitis suppurativa 09/25/2019   Cellulitis of external ear, left 09/25/2019   Hypersomnolence 09/03/2019   Mild intermittent asthma without complication 77/37/3668   Bipolar depression ([place]) 02/14/2017   Severe major depression ([place]) 03/10/2016    Sharika Mosquera P 10/06/2020, 9:01 AM  [facility] [place], [place], [address] Phone: [phone]   Fax:  [phone]  Name: SHERRIAN NUNNELLEY MRN: [id number] Date of Birth: 10-27-2000

## 2020-10-06 NOTE — Therapy (Addendum)
[facility] [place], [place], [address] Phone: [phone]   Fax:  [phone]  Physical Therapy Treatment  Patient Details  Name: Frances Clark MRN: [id number] Date of Birth: Jul 15, 2000 Referring Provider (PT): Hendricks Limes   Encounter Date: 10/06/2020   PT End of Session - 10/06/20 0829     Visit Number 6    Number of Visits 12    Date for PT Re-Evaluation 09/09/20    PT Start Time 0818    PT Stop Time 5852    PT Time Calculation (min) 41 min    Activity Tolerance Patient tolerated treatment well;Patient limited by pain    Behavior During Therapy [facility] for tasks assessed/performed             Past Medical History:  Diagnosis Date   ADHD (attention deficit hyperactivity disorder)    Anxiety    Asthma    Bipolar 2 disorder ([place])    Depression    Polycystic ovarian syndrome     Past Surgical History:  Procedure Laterality Date   CHOLECYSTECTOMY N/A 10/20/2018   Procedure: LAPAROSCOPIC CHOLECYSTECTOMY;  Surgeon: Ralene Ok, MD;  Location: [place];  Service: General;  Laterality: N/A;   ENDOSCOPIC RETROGRADE CHOLANGIOPANCREATOGRAPHY (ERCP) WITH PROPOFOL N/A 10/21/2018   Procedure: ENDOSCOPIC RETROGRADE CHOLANGIOPANCREATOGRAPHY (ERCP) WITH PROPOFOL;  Surgeon: Clarene Essex, MD;  Location: [place];  Service: Endoscopy;  Laterality: N/A;   REMOVAL OF STONES  10/21/2018   Procedure: REMOVAL OF STONES;  Surgeon: Clarene Essex, MD;  Location: [facility] ENDOSCOPY;  Service: Endoscopy;;   SPHINCTEROTOMY  10/21/2018   Procedure: Joan Mayans;  Surgeon: Clarene Essex, MD;  Location: [place];  Service: Endoscopy;;   WISDOM TOOTH EXTRACTION      There were no vitals filed for this visit.   Subjective Assessment - 10/06/20 0823     Subjective COVID-19 screen performed prior to patient entering clinic. Patient arrived with some ongoing back pain and went roller skating over weekend, did not do a lot due to not knowing how  to skate yet was [place] on rink [place] attempting for about 20 min. "no falls yet almost did several times"    Pertinent History ADHD, Anxiety, Asthma, Bipolar Disorder 2, Depression    Limitations Sitting;Lifting;Walking;Standing;House hold activities    How long can you sit comfortably? "few hours"    How long can you stand comfortably? "5-10 minutes"    How long can you walk comfortably? "<10 minutes"    Diagnostic tests x-ray: normal results    Patient Stated Goals return to PLOF    Currently in Pain? Yes    Pain Score 4     Pain Location Back    Pain Orientation Left;Lower    Pain Descriptors / Indicators Discomfort    Pain Type Chronic pain    Pain Onset More than a month ago    Pain Frequency Intermittent    Aggravating Factors  certain movements and activity    Pain Relieving Factors rest                               [facility] Adult PT Treatment/Exercise - 10/06/20 0001       Lumbar Exercises: Stretches   Single Knee to Chest Stretch Right;Left;3 reps;20 seconds    Piriformis Stretch Left;Right;3 reps;30 seconds      Lumbar Exercises: Aerobic   Nustep L3 x74mn UE/LE monitored      Lumbar  Exercises: Standing   Other Standing Lumbar Exercises standing ball step outs and diagnols with abdominal bracing 2x10 each      Lumbar Exercises: Seated   Other Seated Lumbar Exercises seated rows and latt pull with green t-band 2x10 each with abdominal bracing    Other Seated Lumbar Exercises seated core sets with red swiss ball x50mn      Lumbar Exercises: Supine   Bridge with clamshell 20 reps    Bridge with Ball Squeeze Limitations red band    Other Supine Lumbar Exercises ball squeeze 10sec x20reps                         PT Long Term Goals - 10/06/20 0829       PT LONG TERM GOAL #1   Title Patient will be independent with advanced HEP    Baseline issued and reviewed 08/27/20    Time 6    Period Weeks    Status Achieved      PT LONG  TERM GOAL #2   Title Patient will report ability to walk around community for 20 mins or greater with low back pain less than or equal to 3/10    Baseline 6/10 with community walking 10/06/20    Time 6    Period Weeks    Status Not Met      PT LONG TERM GOAL #3   Title Patient will report a centralization of neurological symptoms to low back or report no neurological symptoms to indicate no nerve irritation.    Baseline ongoing bilateral neurological symptoms to bilateral anterior thighs 10/06/20    Time 6    Period Weeks    Status Not Met      PT LONG TERM GOAL #4   Title Patient will report ability to perform ADLs with low back pain less than or equal to 3/10.    Baseline 4/10 to 6-7/10 per reported with ADL's 10/06/20    Time 6    Period Weeks    Status Not Met                   Plan - 10/06/20 0834     Clinical Impression Statement Patient tolerated treatment fair due to ongoing back pain and symptoms. Patient able to progress with low level abdominal bracing with core progression seated and standing activities. Patient limited with walking due to pain increases to 6-7/10 with 20 min. patient has reported no relief in symptoms thus far.  Patient remaining goals not met due to pain deficts. To MD on wednesday for f/u.    Personal Factors and Comorbidities Comorbidity 1    Comorbidities ADHD, Anxiety, Asthma, Bipolar Disorder 2, Depression    Examination-Activity Limitations Other;Stand;Locomotion Level    Examination-Participation Restrictions Other    Stability/Clinical Decision Making Stable/Uncomplicated    Rehab Potential Good    PT Frequency 2x / week    PT Duration 6 weeks    PT Treatment/Interventions ADLs/Self Care Home Management;Ultrasound;Traction;Moist Heat;Therapeutic activities;Therapeutic exercise;Balance training;Neuromuscular re-education;Manual techniques;Passive range of motion;Patient/family education    PT Next Visit Plan on hold per MD appt     Consulted and Agree with Plan of Care Patient             Patient will benefit from skilled therapeutic intervention in order to improve the following deficits and impairments:  Pain, Postural dysfunction, Decreased activity tolerance  Visit Diagnosis: Chronic bilateral low back pain with bilateral sciatica  Abnormal posture     Problem List Patient Active Problem List   Diagnosis Date Noted   Epigastric pain 08/13/2020   Gastroesophageal reflux disease without esophagitis 08/13/2020   Loose stools 08/13/2020   Nausea 08/13/2020   Nickel allergy 10/25/2019   Armpit abscess 10/25/2019   Axillary hidradenitis suppurativa 09/25/2019   Cellulitis of external ear, left 09/25/2019   Hypersomnolence 09/03/2019   Mild intermittent asthma without complication 47/11/2955   Bipolar depression ([place]) 02/14/2017   Severe major depression ([place]) 03/10/2016    Ladean Raya, PTA 10/06/20 9:07 AM   [facility] [place], [place], [address] Phone: [phone]   Fax:  [phone]  Name: Frances Clark MRN: [id number] Date of Birth: [birth year]  PHYSICAL THERAPY DISCHARGE SUMMARY  Visits from Start of Care: 6.  Current functional level related to goals / functional outcomes: See above.   Remaining deficits: See below.   Education / Equipment: HEP.   Patient agrees to discharge. Patient goals were not met. Patient is being discharged due to not returning since the last visit.     Mali Applegate MPT

## 2020-10-09 ENCOUNTER — Encounter: Payer: Self-pay | Admitting: Family Medicine

## 2020-10-09 ENCOUNTER — Other Ambulatory Visit: Payer: Self-pay

## 2020-10-09 ENCOUNTER — Ambulatory Visit (INDEPENDENT_AMBULATORY_CARE_PROVIDER_SITE_OTHER): Payer: 59 | Admitting: Family Medicine

## 2020-10-09 ENCOUNTER — Telehealth: Payer: Self-pay | Admitting: Pulmonary Disease

## 2020-10-09 VITALS — BP 129/83 | HR 103 | Temp 97.2°F | Resp 20 | Ht 64.0 in | Wt 291.0 lb

## 2020-10-09 DIAGNOSIS — R0683 Snoring: Secondary | ICD-10-CM

## 2020-10-09 DIAGNOSIS — M5442 Lumbago with sciatica, left side: Secondary | ICD-10-CM

## 2020-10-09 DIAGNOSIS — G4710 Hypersomnia, unspecified: Secondary | ICD-10-CM

## 2020-10-09 DIAGNOSIS — M5441 Lumbago with sciatica, right side: Secondary | ICD-10-CM | POA: Diagnosis not present

## 2020-10-09 DIAGNOSIS — G8929 Other chronic pain: Secondary | ICD-10-CM | POA: Diagnosis not present

## 2020-10-09 MED ORDER — TIZANIDINE HCL 4 MG PO TABS: 4.0000 mg | ORAL_TABLET | Freq: Three times a day (TID) | ORAL | 2 refills | Status: AC | PRN

## 2020-10-09 NOTE — Telephone Encounter (Signed)
Pt is calling because she was never notified to do a HST and her last OV with Dr. Vassie Loll was 08/2019 in [facility]. Pls regard; [phone]

## 2020-10-09 NOTE — Progress Notes (Signed)
Assessment & Plan:  1. Chronic midline low back pain with bilateral sciatica Proceeding with MRI since patient has had a negative x-ray and failed physical therapy.  I am going ahead and placing a referral to orthopedics and hopefully the MRI will be completed before she sees them. - tiZANidine (ZANAFLEX) 4 MG tablet; Take 1 tablet (4 mg total) by mouth every 8 (eight) hours as needed.  Dispense: 30 tablet; Refill: 2 - MR Lumbar Spine Wo Contrast; Future - Ambulatory referral to Orthopedic Surgery   Return in about 3 months (around 01/09/2021) for annual physical.  Deliah Boston, MSN, APRN, FNP-C [facility]  Subjective:    Patient ID: Frances Clark, adult    DOB: 02/18/2001, 20 y.o.   MRN: [id number]  Patient Care Team: [name], FNP as PCP - General (Family Medicine) Georgiann Cocker, MD as Consulting Physician (Psychiatry)   Chief Complaint:  Chief Complaint  Patient presents with   Back Pain    HPI: Frances Clark is a 20 y.o. adult presenting on 10/09/2020 for Back Pain  Patient is here for follow-up of chronic back pain.  She has completed physical therapy with no improvement in symptoms.  Previous x-ray was normal.  New complaints: None  Social history:  Relevant past medical, surgical, family and social history reviewed and updated as indicated. Interim medical history since our last visit reviewed.  Allergies and medications reviewed and updated.  DATA REVIEWED: CHART IN EPIC  ROS: Negative unless specifically indicated above in HPI.    Current Outpatient Medications:    acetaminophen (TYLENOL) 500 MG tablet, Take 1,000 mg by mouth every 6 (six) hours as needed for mild pain., Disp: , Rfl:    albuterol (VENTOLIN HFA) 108 (90 Base) MCG/ACT inhaler, Inhale 2 puffs into the lungs every 6 (six) hours as needed for wheezing or shortness of breath., Disp: 18 g, Rfl: 5   amphetamine-dextroamphetamine (ADDERALL XR) 5 MG 24 hr  capsule, Take 1 capsule by mouth daily., Disp: , Rfl:    clonazePAM (KLONOPIN) 0.5 MG tablet, Take 0.5 mg by mouth daily as needed., Disp: , Rfl:    DULoxetine (CYMBALTA) 60 MG capsule, Take 1 capsule (60 mg total) by mouth daily. (Patient taking differently: Take 120 mg by mouth daily.), Disp: 90 capsule, Rfl: 2   ibuprofen (ADVIL,MOTRIN) 200 MG tablet, Take 400 mg by mouth daily as needed for headache., Disp: , Rfl:    lamoTRIgine (LAMICTAL) 200 MG tablet, Take 200 mg by mouth at bedtime., Disp: , Rfl:    medroxyPROGESTERone (PROVERA) 10 MG tablet, Take 1 tablet (10 mg total) by mouth daily. X 10days every 3 months, Disp: 10 tablet, Rfl: 3   omeprazole (PRILOSEC) 40 MG capsule, Take 1 capsule (40 mg total) by mouth daily., Disp: 30 capsule, Rfl: 3   ondansetron (ZOFRAN) 4 MG tablet, Take 1 tablet (4 mg total) by mouth every 8 (eight) hours as needed for nausea or vomiting., Disp: 20 tablet, Rfl: 0   psyllium (METAMUCIL) 0.52 g capsule, Take 1 capsule (0.52 g total) by mouth daily., Disp: 30 capsule, Rfl: 0   albuterol (PROVENTIL) (5 MG/ML) 0.5% nebulizer solution, Inhale 0.5 mLs into the lungs every 4 (four) hours as needed for wheezing. (Patient not taking: No sig reported), Disp: , Rfl:    tiZANidine (ZANAFLEX) 4 MG tablet, Take 1 tablet (4 mg total) by mouth every 8 (eight) hours as needed., Disp: 30 tablet, Rfl: 2   Allergies  Allergen Reactions   Other     CATS   Past Medical History:  Diagnosis Date   ADHD (attention deficit hyperactivity disorder)    Anxiety    Asthma    Bipolar 2 disorder ([facility])    Depression    Polycystic ovarian syndrome     Past Surgical History:  Procedure Laterality Date   CHOLECYSTECTOMY N/A 10/20/2018   Procedure: LAPAROSCOPIC CHOLECYSTECTOMY;  Surgeon: Axel Filler, MD;  Location: [facility] OR;  Service: General;  Laterality: N/A;   ENDOSCOPIC RETROGRADE CHOLANGIOPANCREATOGRAPHY (ERCP) WITH PROPOFOL N/A 10/21/2018   Procedure: ENDOSCOPIC RETROGRADE  CHOLANGIOPANCREATOGRAPHY (ERCP) WITH PROPOFOL;  Surgeon: Vida Rigger, MD;  Location: [facility] ENDOSCOPY;  Service: Endoscopy;  Laterality: N/A;   REMOVAL OF STONES  10/21/2018   Procedure: REMOVAL OF STONES;  Surgeon: Vida Rigger, MD;  Location: [facility] ENDOSCOPY;  Service: Endoscopy;;   SPHINCTEROTOMY  10/21/2018   Procedure: Dennison Mascot;  Surgeon: Vida Rigger, MD;  Location: [facility] END[facility]COPY;  Service: Endoscopy;;   WISDOM TOOTH EXTRACTION      Social History   Socioeconomic History   Marital status: Single    Spouse name: Not on file   Number of children: Not on file   Years of education: Not on file   Highest education level: Not on file  Occupational History   Not on file  Tobacco Use   Smoking status: Never   Smokeless tobacco: Never  Vaping Use   Vaping Use: Never used  Substance and Sexual Activity   Alcohol use: Yes    Comment: twice a month   Drug use: No   Sexual activity: Never    Birth control/protection: None  Other Topics Concern   Not on file  Social History Narrative   Not on file   Social Determinants of Health   Financial Resource Strain: Not on file  Food Insecurity: Not on file  Transportation Needs: Not on file  Physical Activity: Not on file  Stress: Not on file  Social Connections: Not on file  Intimate Partner Violence: Not on file        Objective:    BP 129/83   Pulse (!) 103   Temp (!) 97.2 F (36.2 C) (Temporal)   Resp 20   Ht 5\' 4"  (1.626 m)   Wt 291 lb (132 kg)   SpO2 95%   BMI 49.95 kg/m   Wt Readings from Last 3 Encounters:  10/09/20 291 lb (132 kg)  08/13/20 295 lb (133.8 kg)  07/09/20 293 lb (132.9 kg)    Physical Exam Vitals reviewed.  Constitutional:      General: Frances Clark is not in acute distress.    Appearance: Normal appearance. Frances Clark is not ill-appearing, toxic-appearing or diaphoretic.  HENT:     Head: Normocephalic and atraumatic.  Eyes:     General: No scleral icterus.       Right eye: No  discharge.        Left eye: No discharge.     Conjunctiva/sclera: Conjunctivae normal.  Cardiovascular:     Rate and Rhythm: Normal rate.  Pulmonary:     Effort: Pulmonary effort is normal. No respiratory distress.  Musculoskeletal:        General: Normal range of motion.     Cervical back: Normal range of motion.     Lumbar back: Tenderness (left SI joint) present.  Skin:    General: Skin is warm and dry.  Neurological:     Mental Status: Frances Clark  is alert and oriented to person, place, and time. Mental status is at baseline.  Psychiatric:        Mood and Affect: Mood normal.        Behavior: Behavior normal.        Thought Content: Thought content normal.        Judgment: Judgment normal.    Lab Results  Component Value Date   TSH 3.760 06/18/2019   Lab Results  Component Value Date   WBC 9.0 10/22/2018   HGB 10.7 (L) 10/22/2018   HCT 34.6 (L) 10/22/2018   MCV 88.3 10/22/2018   PLT 330 10/22/2018   Lab Results  Component Value Date   NA 140 10/22/2018   K 3.7 10/22/2018   CO2 23 10/22/2018   GLUCOSE 114 (H) 10/22/2018   BUN 6 10/22/2018   CREATININE 0.69 10/22/2018   BILITOT 0.6 10/22/2018   ALKPHOS 119 10/22/2018   AST 343 (H) 10/22/2018   ALT 611 (H) 10/22/2018   PROT 6.0 (L) 10/22/2018   ALBUMIN 3.2 (L) 10/22/2018   CALCIUM 8.7 (L) 10/22/2018   ANIONGAP 11 10/22/2018   Lab Results  Component Value Date   CHOL 135 03/11/2016   Lab Results  Component Value Date   HDL 47 03/11/2016   Lab Results  Component Value Date   LDLCALC 71 03/11/2016   Lab Results  Component Value Date   TRIG 85 03/11/2016   Lab Results  Component Value Date   CHOLHDL 2.9 03/11/2016   Lab Results  Component Value Date   HGBA1C 5.6 06/18/2019

## 2020-10-09 NOTE — Telephone Encounter (Signed)
I do not see a order on this one

## 2020-10-09 NOTE — Telephone Encounter (Signed)
Called and spoke with patient who is calling because she has not heard about the HST that was mentioned at her last OV. AVS says to order HST but does not look like it was ordered. Advised patient that I would place the order and then she would hear from the [facility] to get her scheduled. Patient expressed understanding. Nothing further needed at this time.

## 2020-10-22 ENCOUNTER — Ambulatory Visit (INDEPENDENT_AMBULATORY_CARE_PROVIDER_SITE_OTHER): Payer: 59 | Admitting: Family Medicine

## 2020-10-22 ENCOUNTER — Other Ambulatory Visit: Payer: Self-pay

## 2020-10-22 ENCOUNTER — Encounter: Payer: Self-pay | Admitting: Family Medicine

## 2020-10-22 VITALS — BP 119/76 | HR 78 | Temp 97.5°F | Ht 64.0 in | Wt 292.2 lb

## 2020-10-22 DIAGNOSIS — G8929 Other chronic pain: Secondary | ICD-10-CM | POA: Diagnosis not present

## 2020-10-22 DIAGNOSIS — M5442 Lumbago with sciatica, left side: Secondary | ICD-10-CM

## 2020-10-22 DIAGNOSIS — M5441 Lumbago with sciatica, right side: Secondary | ICD-10-CM

## 2020-10-22 MED ORDER — METHYLPREDNISOLONE ACETATE 40 MG/ML IJ SUSP
80.0000 mg | Freq: Once | INTRAMUSCULAR | Status: AC
  Administered 2020-10-22: 80 mg via INTRAMUSCULAR

## 2020-10-22 NOTE — Progress Notes (Signed)
Acute Office Visit  Subjective:    Patient ID: Frances Clark, adult    DOB: 2000/05/08, 19 y.o.   MRN: [id number]  Chief Complaint  Patient presents with   Back Pain    Pt presents today with complaints of ongoing back pain. Pt was seen by PCP 10/09/2020 for same and placed on Zanaflex. Pt just completed 6 weeks of PT and has an upcoming MRI on 10/31/2020. Pt also has an upcoming appointment with ortho on 11/05/2020.   Back Pain This is a chronic problem. The current episode started more than 1 month ago. The problem occurs constantly. The problem has been waxing and waning since onset. The pain is present in the gluteal, lumbar spine and sacro-iliac. The quality of the pain is described as aching, burning, shooting and stabbing. The pain radiates to the right thigh and left thigh. The pain is at a severity of 6/10. The pain is moderate. The symptoms are aggravated by twisting, standing, sitting, position, bending and lying down. Associated symptoms include leg pain. Pertinent negatives include no abdominal pain, bladder incontinence, bowel incontinence, chest pain, dysuria, fever, headaches, numbness, paresis, paresthesias, pelvic pain, perianal numbness, tingling, weakness or weight loss. Frances Clark has tried muscle relaxant, ice, NSAIDs, analgesics and home exercises (PT) for the symptoms. The treatment provided mild relief.   Past Medical History:  Diagnosis Date   ADHD (attention deficit hyperactivity disorder)    Anxiety    Asthma    Bipolar 2 disorder (HCC)    Depression    Polycystic ovarian syndrome     Past Surgical History:  Procedure Laterality Date   CHOLECYSTECTOMY N/A 10/20/2018   Procedure: LAPAROSCOPIC CHOLECYSTECTOMY;  Surgeon: Axel Filler, MD;  Location: [facility] OR;  Service: General;  Laterality: N/A;   ENDOSCOPIC RETROGRADE CHOLANGIOPANCREATOGRAPHY (ERCP) WITH PROPOFOL N/A 10/21/2018   Procedure: ENDOSCOPIC RETROGRADE CHOLANGIOPANCREATOGRAPHY (ERCP) WITH  PROPOFOL;  Surgeon: Vida Rigger, MD;  Location: [facility] ENDOSCOPY;  Service: Endoscopy;  Laterality: N/A;   REMOVAL OF STONES  10/21/2018   Procedure: REMOVAL OF STONES;  Surgeon: Vida Rigger, MD;  Location: [facility] ENDOSCOPY;  Service: Endoscopy;;   SPHINCTEROTOMY  10/21/2018   Procedure: Dennison Mascot;  Surgeon: Vida Rigger, MD;  Location: [facility] ENDOSCOPY;  Service: Endoscopy;;   WISDOM TOOTH EXTRACTION      Family History  Problem Relation Age of Onset   Anxiety disorder Mother    Drug abuse Mother    Anxiety disorder Maternal Aunt    Anxiety disorder Maternal Grandmother    Alcohol abuse Maternal Grandmother    Alcohol abuse Paternal Grandmother    Depression Paternal Grandmother    Anxiety disorder Paternal Grandmother    Drug abuse Maternal Aunt    Asthma Sister    Diabetes Maternal Grandfather    Heart disease Paternal Grandfather     Social History   Socioeconomic History   Marital status: Single    Spouse name: Not on file   Number of children: Not on file   Years of education: Not on file   Highest education level: Not on file  Occupational History   Not on file  Tobacco Use   Smoking status: Never   Smokeless tobacco: Never  Vaping Use   Vaping Use: Never used  Substance and Sexual Activity   Alcohol use: Yes    Comment: twice a month   Drug use: No   Sexual activity: Never    Birth control/protection: None  Other Topics Concern   Not on file  Social History Narrative   Not on file   Social Determinants of Health   Financial Resource Strain: Not on file  Food Insecurity: Not on file  Transportation Needs: Not on file  Physical Activity: Not on file  Stress: Not on file  Social Connections: Not on file  Intimate Partner Violence: Not on file    Outpatient Medications Prior to Visit  Medication Sig Dispense Refill   acetaminophen (TYLENOL) 500 MG tablet Take 1,000 mg by mouth every 6 (six) hours as needed for mild pain.     albuterol (PROVENTIL) (5 MG/ML) 0.5%  nebulizer solution Inhale 0.5 mLs into the lungs every 4 (four) hours as needed for wheezing.     albuterol (VENTOLIN HFA) 108 (90 Base) MCG/ACT inhaler Inhale 2 puffs into the lungs every 6 (six) hours as needed for wheezing or shortness of breath. 18 g 5   amphetamine-dextroamphetamine (ADDERALL XR) 5 MG 24 hr capsule Take 1 capsule by mouth daily.     clonazePAM (KLONOPIN) 0.5 MG tablet Take 0.5 mg by mouth daily as needed.     DULoxetine (CYMBALTA) 60 MG capsule Take 1 capsule (60 mg total) by mouth daily. (Patient taking differently: Take 120 mg by mouth daily.) 90 capsule 2   ibuprofen (ADVIL,MOTRIN) 200 MG tablet Take 400 mg by mouth daily as needed for headache.     lamoTRIgine (LAMICTAL) 200 MG tablet Take 200 mg by mouth at bedtime.     medroxyPROGESTERone (PROVERA) 10 MG tablet Take 1 tablet (10 mg total) by mouth daily. X 10days every 3 months 10 tablet 3   omeprazole (PRILOSEC) 40 MG capsule Take 1 capsule (40 mg total) by mouth daily. 30 capsule 3   ondansetron (ZOFRAN) 4 MG tablet Take 1 tablet (4 mg total) by mouth every 8 (eight) hours as needed for nausea or vomiting. 20 tablet 0   tiZANidine (ZANAFLEX) 4 MG tablet Take 1 tablet (4 mg total) by mouth every 8 (eight) hours as needed. 30 tablet 2   psyllium (METAMUCIL) 0.52 g capsule Take 1 capsule (0.52 g total) by mouth daily. 30 capsule 0   No facility-administered medications prior to visit.    Allergies  Allergen Reactions   Other     CATS    Review of Systems  Constitutional:  Negative for activity change, appetite change, chills, diaphoresis, fatigue, fever, unexpected weight change and weight loss.  HENT: Negative.    Eyes: Negative.   Respiratory:  Negative for cough, chest tightness and shortness of breath.   Cardiovascular:  Negative for chest pain, palpitations and leg swelling.  Gastrointestinal:  Negative for abdominal pain, blood in stool, bowel incontinence, constipation, diarrhea, nausea and vomiting.   Endocrine: Negative.   Genitourinary:  Negative for bladder incontinence, decreased urine volume, difficulty urinating, dysuria, frequency, pelvic pain and urgency.  Musculoskeletal:  Positive for arthralgias, back pain and myalgias. Negative for gait problem, joint swelling, neck pain and neck stiffness.  Skin: Negative.   Allergic/Immunologic: Negative.   Neurological:  Negative for dizziness, tingling, weakness, numbness, headaches and paresthesias.  Hematological: Negative.   Psychiatric/Behavioral:  Negative for confusion, hallucinations, sleep disturbance and suicidal ideas.   All other systems reviewed and are negative.     Objective:    Physical Exam Vitals and nursing note reviewed.  Constitutional:      General: Frances Clark is not in acute distress.    Appearance: Normal appearance. Frances Clark is well-developed and well-groomed. Frances Clark is not ill-appearing,  toxic-appearing or diaphoretic.  HENT:     Head: Normocephalic and atraumatic.     Jaw: There is normal jaw occlusion.     Right Ear: Hearing normal.     Left Ear: Hearing normal.     Nose: Nose normal.     Mouth/Throat:     Lips: Pink.     Mouth: Mucous membranes are moist.     Pharynx: Oropharynx is clear. Uvula midline.  Eyes:     General: Lids are normal.     Extraocular Movements: Extraocular movements intact.     Conjunctiva/sclera: Conjunctivae normal.     Pupils: Pupils are equal, round, and reactive to light.  Neck:     Thyroid: No thyroid mass, thyromegaly or thyroid tenderness.     Vascular: No carotid bruit or JVD.     Trachea: Trachea and phonation normal.  Cardiovascular:     Rate and Rhythm: Normal rate and regular rhythm.     Chest Wall: PMI is not displaced.     Pulses: Normal pulses.     Heart sounds: Normal heart sounds. No murmur heard.   No friction rub. No gallop.  Pulmonary:     Effort: Pulmonary effort is normal. No respiratory distress.     Breath sounds: Normal  breath sounds. No wheezing.  Abdominal:     General: Bowel sounds are normal. There is no distension or abdominal bruit.     Palpations: Abdomen is soft. There is no hepatomegaly or splenomegaly.     Tenderness: There is no abdominal tenderness. There is no right CVA tenderness or left CVA tenderness.     Hernia: No hernia is present.  Musculoskeletal:     Cervical back: Normal, normal range of motion and neck supple.     Thoracic back: Normal.     Lumbar back: Tenderness present. No swelling, edema, deformity, signs of trauma, lacerations, spasms or bony tenderness. Normal range of motion. Positive left straight leg raise test. Negative right straight leg raise test. No scoliosis.     Right hip: Normal.     Left hip: Normal.     Right lower leg: No edema.     Left lower leg: No edema.  Lymphadenopathy:     Cervical: No cervical adenopathy.  Skin:    General: Skin is warm and dry.     Capillary Refill: Capillary refill takes less than 2 seconds.     Coloration: Skin is not cyanotic, jaundiced or pale.     Findings: No rash.  Neurological:     General: No focal deficit present.     Mental Status: Frances Clark is alert and oriented to person, place, and time.     Cranial Nerves: Cranial nerves are intact.     Sensory: Sensation is intact.     Motor: Motor function is intact.     Coordination: Coordination is intact.     Gait: Gait is intact.     Deep Tendon Reflexes: Reflexes are normal and symmetric.  Psychiatric:        Attention and Perception: Attention and perception normal.        Mood and Affect: Mood and affect normal.        Speech: Speech normal.        Behavior: Behavior normal. Behavior is cooperative.        Thought Content: Thought content normal.        Cognition and Memory: Cognition and memory normal.  Judgment: Judgment normal.    BP 119/76   Pulse 78   Temp (!) 97.5 F (36.4 C) (Temporal)   Ht 5\' 4"  (1.626 m)   Wt 292 lb 3.2 oz (132.5 kg)    SpO2 96%   BMI 50.16 kg/m  Wt Readings from Last 3 Encounters:  10/22/20 292 lb 3.2 oz (132.5 kg)  10/09/20 291 lb (132 kg)  08/13/20 295 lb (133.8 kg)    Health Maintenance Due  Topic Date Due   HIV Screening  Never done   Hepatitis C Screening  Never done   COVID-19 Vaccine (3 - Booster) 03/17/2020   INFLUENZA VACCINE  10/13/2020    There are no preventive care reminders to display for this patient.   Lab Results  Component Value Date   TSH 3.760 06/18/2019   Lab Results  Component Value Date   WBC 9.0 10/22/2018   HGB 10.7 (L) 10/22/2018   HCT 34.6 (L) 10/22/2018   MCV 88.3 10/22/2018   PLT 330 10/22/2018   Lab Results  Component Value Date   NA 140 10/22/2018   K 3.7 10/22/2018   CO2 23 10/22/2018   GLUCOSE 114 (H) 10/22/2018   BUN 6 10/22/2018   CREATININE 0.69 10/22/2018   BILITOT 0.6 10/22/2018   ALKPHOS 119 10/22/2018   AST 343 (H) 10/22/2018   ALT 611 (H) 10/22/2018   PROT 6.0 (L) 10/22/2018   ALBUMIN 3.2 (L) 10/22/2018   CALCIUM 8.7 (L) 10/22/2018   ANIONGAP 11 10/22/2018   Lab Results  Component Value Date   CHOL 135 03/11/2016   Lab Results  Component Value Date   HDL 47 03/11/2016   Lab Results  Component Value Date   LDLCALC 71 03/11/2016   Lab Results  Component Value Date   TRIG 85 03/11/2016   Lab Results  Component Value Date   CHOLHDL 2.9 03/11/2016   Lab Results  Component Value Date   HGBA1C 5.6 06/18/2019       Assessment & Plan:  Sailor was seen today for back pain.  Diagnoses and all orders for this visit:  Chronic midline low back pain with bilateral sciatica Chronic low back pain with bilateral sciatica. No red flags present concerning for cauda equina syndrome. Has completed 6 weeks of PT and has follow up with ortho scheduled after upcoming MRI. Will burst with steroids today. Continue Zanaflex as prescribed. Can take OTC tylenol and motrin as needed. Report any new or worsening symptoms.   The above  assessment and management plan was discussed with the patient. The patient verbalized understanding of and has agreed to the management plan. Patient is aware to call the clinic if they develop any new symptoms or if symptoms fail to improve or worsen. Patient is aware when to return to the clinic for a follow-up visit. Patient educated on when it is appropriate to go to the emergency department.   Return if symptoms worsen or fail to improve.  Karma Ganja, FNP-C [facility] [address] [place], [place] [phone]

## 2020-10-22 NOTE — Addendum Note (Signed)
Addended by: Tamera Punt on: 10/22/2020 04:04 PM   Modules accepted: Orders

## 2020-10-24 ENCOUNTER — Ambulatory Visit (HOSPITAL_COMMUNITY): Payer: 59

## 2020-10-27 ENCOUNTER — Ambulatory Visit (HOSPITAL_COMMUNITY): Payer: 59

## 2020-10-31 ENCOUNTER — Ambulatory Visit (HOSPITAL_COMMUNITY): Admission: RE | Admit: 2020-10-31 | Payer: 59 | Source: Ambulatory Visit

## 2020-11-03 ENCOUNTER — Ambulatory Visit: Payer: 59

## 2020-11-03 ENCOUNTER — Other Ambulatory Visit: Payer: Self-pay

## 2020-11-03 DIAGNOSIS — R0683 Snoring: Secondary | ICD-10-CM

## 2020-11-03 DIAGNOSIS — G4710 Hypersomnia, unspecified: Secondary | ICD-10-CM

## 2020-11-03 DIAGNOSIS — G4733 Obstructive sleep apnea (adult) (pediatric): Secondary | ICD-10-CM

## 2020-11-05 ENCOUNTER — Ambulatory Visit: Payer: 59 | Admitting: Orthopaedic Surgery

## 2020-11-06 ENCOUNTER — Other Ambulatory Visit: Payer: Self-pay

## 2020-11-06 ENCOUNTER — Encounter: Payer: Self-pay | Admitting: Family Medicine

## 2020-11-06 ENCOUNTER — Ambulatory Visit (INDEPENDENT_AMBULATORY_CARE_PROVIDER_SITE_OTHER): Payer: 59 | Admitting: Family Medicine

## 2020-11-06 VITALS — BP 132/82 | HR 93 | Temp 97.6°F | Ht 64.0 in | Wt 296.4 lb

## 2020-11-06 DIAGNOSIS — J358 Other chronic diseases of tonsils and adenoids: Secondary | ICD-10-CM | POA: Diagnosis not present

## 2020-11-06 NOTE — Progress Notes (Signed)
Assessment & Plan:  1. 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  [name], NP Student  Subjective:   Patient ID: Frances Clark, adult    DOB: [birth year], 20 y.o.   MRN: [id number]  HPI: Frances Clark is a 20 y.o. adult presenting on 11/06/2020 for swollen tonsil  (Patient states it started a few days ago)  She states the swollen tonsil is on the right and she has a history of tonsil stones. She has successfully removed several from the affected side but the swelling has not gone down. She has not done saltwater rinses or mouthwash.    ROS: Negative unless specifically indicated above in HPI.   Relevant past medical history reviewed and updated as indicated.   Allergies and medications reviewed and updated.   Current Outpatient Medications:    acetaminophen (TYLENOL) 500 MG tablet, Take 1,000 mg by mouth every 6 (six) hours as needed for mild pain., Disp: , Rfl:    albuterol (PROVENTIL) (5 MG/ML) 0.5% nebulizer solution, Inhale 0.5 mLs into the lungs every 4 (four) hours as needed for wheezing., Disp: , Rfl:    albuterol (VENTOLIN HFA) 108 (90 Base) MCG/ACT inhaler, Inhale 2 puffs into the lungs every 6 (six) hours as needed for wheezing or shortness of breath., Disp: 18 g, Rfl: 5   amphetamine-dextroamphetamine (ADDERALL XR) 5 MG 24 hr capsule, Take 1 capsule by mouth daily., Disp: , Rfl:    clonazePAM (KLONOPIN) 0.5 MG tablet, Take 0.5 mg by mouth daily as needed., Disp: , Rfl:    DULoxetine (CYMBALTA) 60 MG capsule, Take 1 capsule (60 mg total) by mouth daily. (Patient taking differently: Take 120 mg by mouth daily.), Disp: 90 capsule, Rfl: 2   ibuprofen (ADVIL,MOTRIN) 200 MG tablet, Take 400 mg by mouth daily as needed for headache., Disp: , Rfl:    lamoTRIgine (LAMICTAL) 200 MG tablet, Take 200 mg by mouth at bedtime., Disp: , Rfl:     medroxyPROGESTERone (PROVERA) 10 MG tablet, Take 1 tablet (10 mg total) by mouth daily. X 10days every 3 months, Disp: 10 tablet, Rfl: 3   omeprazole (PRILOSEC) 40 MG capsule, Take 1 capsule (40 mg total) by mouth daily., Disp: 30 capsule, Rfl: 3   ondansetron (ZOFRAN) 4 MG tablet, Take 1 tablet (4 mg total) by mouth every 8 (eight) hours as needed for nausea or vomiting., Disp: 20 tablet, Rfl: 0   tiZANidine (ZANAFLEX) 4 MG tablet, Take 1 tablet (4 mg total) by mouth every 8 (eight) hours as needed., Disp: 30 tablet, Rfl: 2  Allergies  Allergen Reactions   Other     CATS    Objective:   BP 132/82   Pulse 93   Temp 97.6 F (36.4 C) (Temporal)   Ht 5\' 4"  (1.626 m)   Wt 134.4 kg   BMI 50.88 kg/m    Physical Exam Constitutional:      General: [name] is not in acute distress.    Appearance: [name] is obese. [name] is not ill-appearing, toxic-appearing or diaphoretic.  HENT:     Head: Normocephalic and atraumatic.     Nose: Nose normal. No congestion or rhinorrhea.     Mouth/Throat:     Mouth: Mucous membranes are moist.     Pharynx: Posterior oropharyngeal erythema (on right tonsil) present. No oropharyngeal exudate.  Eyes:     Extraocular Movements: Extraocular movements intact.     Conjunctiva/sclera: Conjunctivae normal.     Pupils: Pupils are equal, round, and reactive to light.  Pulmonary:     Effort: Pulmonary effort is normal.  Abdominal:     Palpations: Abdomen is soft.  Musculoskeletal:        General: Normal range of motion.     Cervical back: Normal range of motion.  Skin:    General: Skin is warm and dry.  Neurological:     General: No focal deficit present.     Mental Status: [name] is alert and oriented to person, place, and time.  Psychiatric:        Mood and Affect: Mood normal.        Behavior: Behavior normal.        Thought Content: Thought content normal.        Judgment: Judgment normal.

## 2020-11-07 ENCOUNTER — Telehealth: Payer: Self-pay | Admitting: Pulmonary Disease

## 2020-11-07 DIAGNOSIS — G4710 Hypersomnia, unspecified: Secondary | ICD-10-CM

## 2020-11-07 DIAGNOSIS — G4733 Obstructive sleep apnea (adult) (pediatric): Secondary | ICD-10-CM

## 2020-11-07 NOTE — Telephone Encounter (Signed)
  Home sleep study showed very mild OSA, AHI 6/hour. Since her sleepiness is out of proportion to degree of sleep disordered breathing, we should also check for narcolepsy.  Suggest proceed with CPAP titration study followed by MSLT/nap test

## 2020-11-10 ENCOUNTER — Other Ambulatory Visit: Payer: Self-pay | Admitting: *Deleted

## 2020-11-10 DIAGNOSIS — K219 Gastro-esophageal reflux disease without esophagitis: Secondary | ICD-10-CM

## 2020-11-10 MED ORDER — OMEPRAZOLE 40 MG PO CPDR: 40.0000 mg | DELAYED_RELEASE_CAPSULE | Freq: Every day | ORAL | 1 refills | Status: AC

## 2020-11-11 NOTE — Telephone Encounter (Signed)
Spoke with the pt and notified of results per Dr Vassie Loll  She verbalized understanding and agreeable to cpap titration and mslt Tests were ordered

## 2020-11-26 ENCOUNTER — Encounter: Payer: Self-pay | Admitting: Family Medicine

## 2020-11-26 ENCOUNTER — Ambulatory Visit (INDEPENDENT_AMBULATORY_CARE_PROVIDER_SITE_OTHER): Payer: 59 | Admitting: Family Medicine

## 2020-11-26 ENCOUNTER — Other Ambulatory Visit: Payer: Self-pay

## 2020-11-26 VITALS — BP 116/77 | HR 104 | Temp 97.7°F | Ht 64.0 in | Wt 286.0 lb

## 2020-11-26 DIAGNOSIS — R110 Nausea: Secondary | ICD-10-CM | POA: Diagnosis not present

## 2020-11-26 DIAGNOSIS — K589 Irritable bowel syndrome without diarrhea: Secondary | ICD-10-CM

## 2020-11-26 MED ORDER — SACCHAROMYCES BOULARDII 250 MG PO CAPS: 250.0000 mg | ORAL_CAPSULE | Freq: Two times a day (BID) | ORAL | 2 refills | Status: AC

## 2020-11-26 MED ORDER — ONDANSETRON HCL 4 MG PO TABS: 4.0000 mg | ORAL_TABLET | Freq: Three times a day (TID) | ORAL | 1 refills | Status: AC | PRN

## 2020-11-26 NOTE — Progress Notes (Signed)
Assessment & Plan:  1. Irritable bowel syndrome without diarrhea - printed education about IBS diet and keeping a food diary to identify triggers, add probiotic and zofran PRN - saccharomyces boulardii (FLORASTOR) 250 MG capsule; Take 1 capsule (250 mg total) by mouth 2 (two) times daily.  Dispense: 60 capsule; Refill: 2 - ondansetron (ZOFRAN) 4 MG tablet; Take 1 tablet (4 mg total) by mouth every 8 (eight) hours as needed for nausea or vomiting.  Dispense: 30 tablet; Refill: 1  2. Nausea - hopefully will resolve with IBS management, zofran PRN - ondansetron (ZOFRAN) 4 MG tablet; Take 1 tablet (4 mg total) by mouth every 8 (eight) hours as needed for nausea or vomiting.  Dispense: 30 tablet; Refill: 1   Follow up plan: Return in about 4 weeks (around 12/24/2020) for follow-up of chronic medication conditions.  [name], NP Student  I personally was present during the history, physical exam, and medical decision-making activities of this service and have verified that the service and findings are accurately documented in the nurse practitioner student's note.  Deliah Boston, MSN, APRN, FNP-C [facility]   Subjective:   Patient ID: Frances Clark, adult    DOB: [birth year], 20 y.o.   MRN: [id number]  HPI: Frances Clark is a 20 y.o. adult presenting on 11/26/2020 for Nausea (Patient states that this has been going on for months.  Used to be nauseous daily but now its 3-4 days out of the week. )   She reports nausea in the mornings when she wakes up nearly every day. She states she has not vomited and that is is worse when she has to drive. She states that food sometimes helps the nausea. She has frequent bowel movements accompanied by cramping and urgency that is relieved after defecation. She does not know what foods trigger her symptoms specifically.    ROS: Negative unless specifically indicated above in HPI.   Relevant past medical history reviewed  and updated as indicated.   Allergies and medications reviewed and updated.   Current Outpatient Medications:    acetaminophen (TYLENOL) 500 MG tablet, Take 1,000 mg by mouth every 6 (six) hours as needed for mild pain., Disp: , Rfl:    albuterol (PROVENTIL) (5 MG/ML) 0.5% nebulizer solution, Inhale 0.5 mLs into the lungs every 4 (four) hours as needed for wheezing., Disp: , Rfl:    albuterol (VENTOLIN HFA) 108 (90 Base) MCG/ACT inhaler, Inhale 2 puffs into the lungs every 6 (six) hours as needed for wheezing or shortness of breath., Disp: 18 g, Rfl: 5   amphetamine-dextroamphetamine (ADDERALL XR) 5 MG 24 hr capsule, Take 1 capsule by mouth daily., Disp: , Rfl:    clonazePAM (KLONOPIN) 0.5 MG tablet, Take 0.5 mg by mouth daily as needed., Disp: , Rfl:    DULoxetine (CYMBALTA) 60 MG capsule, Take 1 capsule (60 mg total) by mouth daily. (Patient taking differently: Take 120 mg by mouth daily.), Disp: 90 capsule, Rfl: 2   ibuprofen (ADVIL,MOTRIN) 200 MG tablet, Take 400 mg by mouth daily as needed for headache., Disp: , Rfl:    lamoTRIgine (LAMICTAL) 200 MG tablet, Take 200 mg by mouth at bedtime., Disp: , Rfl:    medroxyPROGESTERone (PROVERA) 10 MG tablet, Take 1 tablet (10 mg total) by mouth daily. X 10days every 3 months, Disp: 10 tablet, Rfl: 3   omeprazole (PRILOSEC) 40 MG capsule, Take 1 capsule (40 mg total) by mouth daily., Disp: 30 capsule, Rfl: 1  saccharomyces boulardii (FLORASTOR) 250 MG capsule, Take 1 capsule (250 mg total) by mouth 2 (two) times daily., Disp: 60 capsule, Rfl: 2   tiZANidine (ZANAFLEX) 4 MG tablet, Take 1 tablet (4 mg total) by mouth every 8 (eight) hours as needed., Disp: 30 tablet, Rfl: 2   ondansetron (ZOFRAN) 4 MG tablet, Take 1 tablet (4 mg total) by mouth every 8 (eight) hours as needed for nausea or vomiting., Disp: 30 tablet, Rfl: 1  Allergies  Allergen Reactions   Other     CATS    Objective:   BP 116/77   Pulse (!) 104   Temp 97.7 F (36.5 C)  (Temporal)   Ht 5\' 4"  (1.626 m)   Wt 129.7 kg   BMI 49.09 kg/m    Physical Exam Constitutional:      General: Frances Clark is not in acute distress.    Appearance: Normal appearance. Frances Clark is obese. Frances Clark is not ill-appearing, toxic-appearing or diaphoretic.  HENT:     Head: Normocephalic and atraumatic.     Nose: Nose normal.     Mouth/Throat:     Mouth: Mucous membranes are moist.     Pharynx: Oropharynx is clear.  Eyes:     Extraocular Movements: Extraocular movements intact.     Conjunctiva/sclera: Conjunctivae normal.     Pupils: Pupils are equal, round, and reactive to light.  Pulmonary:     Effort: Pulmonary effort is normal.  Abdominal:     General: There is no distension.     Palpations: Abdomen is soft.     Tenderness: There is no abdominal tenderness.  Musculoskeletal:        General: Normal range of motion.     Cervical back: Normal range of motion.  Skin:    General: Skin is warm and dry.     Capillary Refill: Capillary refill takes less than 2 seconds.  Neurological:     Mental Status: Frances Clark is alert and oriented to person, place, and time. Mental status is at baseline.  Psychiatric:        Mood and Affect: Mood normal.        Behavior: Behavior normal.        Thought Content: Thought content normal.        Judgment: Judgment normal.

## 2020-11-28 ENCOUNTER — Ambulatory Visit (HOSPITAL_COMMUNITY): Payer: 59

## 2020-12-02 ENCOUNTER — Ambulatory Visit: Payer: 59 | Admitting: Orthopaedic Surgery

## 2020-12-08 ENCOUNTER — Ambulatory Visit (HOSPITAL_COMMUNITY): Payer: 59

## 2020-12-09 ENCOUNTER — Encounter: Payer: Self-pay | Admitting: Family Medicine

## 2020-12-09 ENCOUNTER — Ambulatory Visit (INDEPENDENT_AMBULATORY_CARE_PROVIDER_SITE_OTHER): Payer: 59 | Admitting: Family Medicine

## 2020-12-09 ENCOUNTER — Other Ambulatory Visit: Payer: Self-pay

## 2020-12-09 VITALS — BP 129/72 | HR 94 | Temp 97.6°F | Ht 64.0 in | Wt 294.5 lb

## 2020-12-09 DIAGNOSIS — M5442 Lumbago with sciatica, left side: Secondary | ICD-10-CM

## 2020-12-09 DIAGNOSIS — M5441 Lumbago with sciatica, right side: Secondary | ICD-10-CM | POA: Diagnosis not present

## 2020-12-09 DIAGNOSIS — G8929 Other chronic pain: Secondary | ICD-10-CM | POA: Diagnosis not present

## 2020-12-09 MED ORDER — METHOCARBAMOL 500 MG PO TABS: 500.0000 mg | ORAL_TABLET | Freq: Four times a day (QID) | ORAL | 0 refills | Status: AC

## 2020-12-09 NOTE — Progress Notes (Signed)
Acute Office Visit  Subjective:    Patient ID: Frances Clark, adult    DOB: 2000/04/01, 20 y.o.   MRN: [id number]  Chief Complaint  Patient presents with   Back Pain    HPI Patient is in today for chronic back pain. The pain is in their lower back and radiates The pain has been worse for the last 3 days. The pain is anywhere from a 4-7/10. The pain is worse with movement, bending, twisting. it is constant with intermittent sharp pains. They have tried NSAIDs, tizanidine, steroids, tylenol, heat with little relief. They have also completed PT. They have an appointment with orthopedics next week but will have to reschedule this because her MRI has been rescheduled due to expiration of her pre-authorization from insurance. Currently, her MRI is scheduled for 12/17/20. They denies numbness, tingling, changes in bowel or bladder control, recent injury, fever, or chills.   Past Medical History:  Diagnosis Date   ADHD (attention deficit hyperactivity disorder)    Anxiety    Asthma    Bipolar 2 disorder (HCC)    Depression    Polycystic ovarian syndrome     Past Surgical History:  Procedure Laterality Date   CHOLECYSTECTOMY N/A 10/20/2018   Procedure: LAPAROSCOPIC CHOLECYSTECTOMY;  Surgeon: Axel Filler, MD;  Location: [facility] OR;  Service: General;  Laterality: N/A;   ENDOSCOPIC RETROGRADE CHOLANGIOPANCREATOGRAPHY (ERCP) WITH PROPOFOL N/A 10/21/2018   Procedure: ENDOSCOPIC RETROGRADE CHOLANGIOPANCREATOGRAPHY (ERCP) WITH PROPOFOL;  Surgeon: Vida Rigger, MD;  Location: [facility] ENDOSCOPY;  Service: Endoscopy;  Laterality: N/A;   REMOVAL OF STONES  10/21/2018   Procedure: REMOVAL OF STONES;  Surgeon: Vida Rigger, MD;  Location: [facility] ENDOSCOPY;  Service: Endoscopy;;   SPHINCTEROTOMY  10/21/2018   Procedure: Dennison Mascot;  Surgeon: Vida Rigger, MD;  Location: [facility] ENDOSCOPY;  Service: Endoscopy;;   WISDOM TOOTH EXTRACTION      Family History  Problem Relation Age of Onset   Anxiety disorder Mother     Drug abuse Mother    Anxiety disorder Maternal Aunt    Anxiety disorder Maternal Grandmother    Alcohol abuse Maternal Grandmother    Alcohol abuse Paternal Grandmother    Depression Paternal Grandmother    Anxiety disorder Paternal Grandmother    Drug abuse Maternal Aunt    Asthma Sister    Diabetes Maternal Grandfather    Heart disease Paternal Grandfather     Social History   Socioeconomic History   Marital status: Single    Spouse name: Not on file   Number of children: Not on file   Years of education: Not on file   Highest education level: Not on file  Occupational History   Not on file  Tobacco Use   Smoking status: Never   Smokeless tobacco: Never  Vaping Use   Vaping Use: Never used  Substance and Sexual Activity   Alcohol use: Yes    Comment: twice a month   Drug use: No   Sexual activity: Never    Birth control/protection: None  Other Topics Concern   Not on file  Social History Narrative   Not on file   Social Determinants of Health   Financial Resource Strain: Not on file  Food Insecurity: Not on file  Transportation Needs: Not on file  Physical Activity: Not on file  Stress: Not on file  Social Connections: Not on file  Intimate Partner Violence: Not on file    Outpatient Medications Prior to Visit  Medication Sig Dispense Refill  acetaminophen (TYLENOL) 500 MG tablet Take 1,000 mg by mouth every 6 (six) hours as needed for mild pain.     albuterol (PROVENTIL) (5 MG/ML) 0.5% nebulizer solution Inhale 0.5 mLs into the lungs every 4 (four) hours as needed for wheezing.     albuterol (VENTOLIN HFA) 108 (90 Base) MCG/ACT inhaler Inhale 2 puffs into the lungs every 6 (six) hours as needed for wheezing or shortness of breath. 18 g 5   amphetamine-dextroamphetamine (ADDERALL XR) 5 MG 24 hr capsule Take 1 capsule by mouth daily.     clonazePAM (KLONOPIN) 0.5 MG tablet Take 0.5 mg by mouth daily as needed.     DULoxetine (CYMBALTA) 60 MG capsule Take 1  capsule (60 mg total) by mouth daily. (Patient taking differently: Take 120 mg by mouth daily.) 90 capsule 2   ibuprofen (ADVIL,MOTRIN) 200 MG tablet Take 400 mg by mouth daily as needed for headache.     lamoTRIgine (LAMICTAL) 200 MG tablet Take 200 mg by mouth at bedtime.     medroxyPROGESTERone (PROVERA) 10 MG tablet Take 1 tablet (10 mg total) by mouth daily. X 10days every 3 months 10 tablet 3   omeprazole (PRILOSEC) 40 MG capsule Take 1 capsule (40 mg total) by mouth daily. 30 capsule 1   ondansetron (ZOFRAN) 4 MG tablet Take 1 tablet (4 mg total) by mouth every 8 (eight) hours as needed for nausea or vomiting. 30 tablet 1   saccharomyces boulardii (FLORASTOR) 250 MG capsule Take 1 capsule (250 mg total) by mouth 2 (two) times daily. 60 capsule 2   tiZANidine (ZANAFLEX) 4 MG tablet Take 1 tablet (4 mg total) by mouth every 8 (eight) hours as needed. 30 tablet 2   No facility-administered medications prior to visit.    Allergies  Allergen Reactions   Other     CATS    Review of Systems As per HPI.     Objective:    Physical Exam Vitals and nursing note reviewed.  Constitutional:      General: Frances Clark is not in acute distress.    Appearance: Frances Clark is not ill-appearing, toxic-appearing or diaphoretic.  Cardiovascular:     Rate and Rhythm: Normal rate and regular rhythm.     Heart sounds: Normal heart sounds.  Pulmonary:     Effort: Pulmonary effort is normal. No respiratory distress.     Breath sounds: Normal breath sounds.  Musculoskeletal:     Lumbar back: Tenderness (muscular) present. No swelling, edema, signs of trauma, spasms or bony tenderness. Positive right straight leg raise test and positive left straight leg raise test.  Neurological:     Mental Status: Frances Clark is alert.    BP 129/72   Pulse 94   Temp 97.6 F (36.4 C) (Temporal)   Ht 5\' 4"  (1.626 m)   Wt 294 lb 8 oz (133.6 kg)   BMI 50.55 kg/m  Wt Readings from Last 3  Encounters:  12/09/20 294 lb 8 oz (133.6 kg)  11/26/20 286 lb (129.7 kg)  11/06/20 296 lb 6.4 oz (134.4 kg)    Health Maintenance Due  Topic Date Due   HIV Screening  Never done   Hepatitis C Screening  Never done   COVID-19 Vaccine (3 - Booster) 03/17/2020   TETANUS/TDAP  11/16/2020   PAP-Cervical Cytology Screening  11/19/2020   PAP SMEAR-Modifier  11/19/2020    There are no preventive care reminders to display for this patient.   Lab Results  Component Value Date   TSH 3.760 06/18/2019   Lab Results  Component Value Date   WBC 9.0 10/22/2018   HGB 10.7 (L) 10/22/2018   HCT 34.6 (L) 10/22/2018   MCV 88.3 10/22/2018   PLT 330 10/22/2018   Lab Results  Component Value Date   NA 140 10/22/2018   K 3.7 10/22/2018   CO2 23 10/22/2018   GLUCOSE 114 (H) 10/22/2018   BUN 6 10/22/2018   CREATININE 0.69 10/22/2018   BILITOT 0.6 10/22/2018   ALKPHOS 119 10/22/2018   AST 343 (H) 10/22/2018   ALT 611 (H) 10/22/2018   PROT 6.0 (L) 10/22/2018   ALBUMIN 3.2 (L) 10/22/2018   CALCIUM 8.7 (L) 10/22/2018   ANIONGAP 11 10/22/2018   Lab Results  Component Value Date   CHOL 135 03/11/2016   Lab Results  Component Value Date   HDL 47 03/11/2016   Lab Results  Component Value Date   LDLCALC 71 03/11/2016   Lab Results  Component Value Date   TRIG 85 03/11/2016   Lab Results  Component Value Date   CHOLHDL 2.9 03/11/2016   Lab Results  Component Value Date   HGBA1C 5.6 06/18/2019       Assessment & Plan:   Frances Clark was seen today for back pain.  Diagnoses and all orders for this visit:  Chronic midline low back pain with bilateral sciatica Chronic, no red flags today. Has tried tizanidine, steroids, NSAIDs, tylenol, heat, and PT. Will try robaxin instead of tizanidine. Awaiting MRI that is scheduled for next week. Will see ortho pending MRI results.  -     methocarbamol (ROBAXIN) 500 MG tablet; Take 1 tablet (500 mg total) by mouth 4 (four) times  daily.   Return to office for new or worsening symptoms.   The patient indicates understanding of these issues and agrees with the plan.  Gabriel Earing, FNP

## 2020-12-09 NOTE — Patient Instructions (Signed)
Sciatica Sciatica is pain, numbness, weakness, or tingling along the path of the sciatic nerve. The sciatic nerve starts in the lower back and runs down the back of each leg. The nerve controls the muscles in the lower leg and in the back of the knee. It also provides feeling (sensation) to the back of the thigh, the lower leg, and the sole of the foot. Sciatica is a symptom of another medical condition that pinches or puts pressure on the sciatic nerve. Sciatica most often only affects one side of the body. Sciatica usually goes away on its own or with treatment. In some cases, sciatica may come back (recur). What are the causes? This condition is caused by pressure on the sciatic nerve or pinching of the nerve. This may be the result of: A disk in between the bones of the spine bulging out too far (herniated disk). Age-related changes in the spinal disks. A pain disorder that affects a muscle in the buttock. Extra bone growth near the sciatic nerve. A break (fracture) of the pelvis. Pregnancy. Tumor. This is rare. What increases the risk? The following factors may make you more likely to develop this condition: Playing sports that place pressure or stress on the spine. Having poor strength and flexibility. A history of back injury or surgery. Sitting for long periods of time. Doing activities that involve repetitive bending or lifting. Obesity. What are the signs or symptoms? Symptoms can vary from mild to very severe, and they may include: Any of these problems in the lower back, leg, hip, or buttock: Mild tingling, numbness, or dull aches. Burning sensations. Sharp pains. Numbness in the back of the calf or the sole of the foot. Leg weakness. Severe back pain that makes movement difficult. Symptoms may get worse when you cough, sneeze, or laugh, or when you sit or stand for long periods of time. How is this diagnosed? This condition may be diagnosed based on: Your symptoms and  medical history. A physical exam. Blood tests. Imaging tests, such as: X-rays. MRI. CT scan. How is this treated? In many cases, this condition improves on its own without treatment. However, treatment may include: Reducing or modifying physical activity. Exercising and stretching. Icing and applying heat to the affected area. Medicines that help to: Relieve pain and swelling. Relax your muscles. Injections of medicines that help to relieve pain, irritation, and inflammation around the sciatic nerve (steroids). Surgery. Follow these instructions at home: Medicines Take over-the-counter and prescription medicines only as told by your health care provider. Ask your health care provider if the medicine prescribed to you: Requires you to avoid driving or using heavy machinery. Can cause constipation. You may need to take these actions to prevent or treat constipation: Drink enough fluid to keep your urine pale yellow. Take over-the-counter or prescription medicines. Eat foods that are high in fiber, such as beans, whole grains, and fresh fruits and vegetables. Limit foods that are high in fat and processed sugars, such as fried or sweet foods. Managing pain   If directed, put ice on the affected area. Put ice in a plastic bag. Place a towel between your skin and the bag. Leave the ice on for 20 minutes, 2-3 times a day. If directed, apply heat to the affected area. Use the heat source that your health care provider recommends, such as a moist heat pack or a heating pad. Place a towel between your skin and the heat source. Leave the heat on for 20-30 minutes. Remove   the heat if your skin turns bright red. This is especially important if you are unable to feel pain, heat, or cold. You may have a greater risk of getting burned. Activity  Return to your normal activities as told by your health care provider. Ask your health care provider what activities are safe for you. Avoid  activities that make your symptoms worse. Take brief periods of rest throughout the day. When you rest for longer periods, mix in some mild activity or stretching between periods of rest. This will help to prevent stiffness and pain. Avoid sitting for long periods of time without moving. Get up and move around at least one time each hour. Exercise and stretch regularly, as told by your health care provider. Do not lift anything that is heavier than 10 lb (4.5 kg) while you have symptoms of sciatica. When you do not have symptoms, you should still avoid heavy lifting, especially repetitive heavy lifting. When you lift objects, always use proper lifting technique, which includes: Bending your knees. Keeping the load close to your body. Avoiding twisting. General instructions Maintain a healthy weight. Excess weight puts extra stress on your back. Wear supportive, comfortable shoes. Avoid wearing high heels. Avoid sleeping on a mattress that is too soft or too hard. A mattress that is firm enough to support your back when you sleep may help to reduce your pain. Keep all follow-up visits as told by your health care provider. This is important. Contact a health care provider if: You have pain that: Wakes you up when you are sleeping. Gets worse when you lie down. Is worse than you have experienced in the past. Lasts longer than 4 weeks. You have an unexplained weight loss. Get help right away if: You are not able to control when you urinate or have bowel movements (incontinence). You have: Weakness in your lower back, pelvis, buttocks, or legs that gets worse. Redness or swelling of your back. A burning sensation when you urinate. Summary Sciatica is pain, numbness, weakness, or tingling along the path of the sciatic nerve. This condition is caused by pressure on the sciatic nerve or pinching of the nerve. Sciatica can cause pain, numbness, or tingling in the lower back, legs, hips, and  buttocks. Treatment often includes rest, exercise, medicines, and applying ice or heat. This information is not intended to replace advice given to you by your health care provider. Make sure you discuss any questions you have with your health care provider. Document Revised: 03/20/2018 Document Reviewed: 03/20/2018 Elsevier Patient Education  2022 [employer] Inc.  

## 2020-12-16 ENCOUNTER — Ambulatory Visit: Payer: 59 | Admitting: Orthopaedic Surgery

## 2020-12-17 ENCOUNTER — Ambulatory Visit (HOSPITAL_COMMUNITY): Admission: RE | Admit: 2020-12-17 | Payer: 59 | Source: Ambulatory Visit

## 2020-12-19 ENCOUNTER — Ambulatory Visit: Payer: 59 | Admitting: Orthopaedic Surgery

## 2020-12-23 ENCOUNTER — Encounter: Payer: Self-pay | Admitting: Family Medicine

## 2020-12-23 ENCOUNTER — Encounter (HOSPITAL_BASED_OUTPATIENT_CLINIC_OR_DEPARTMENT_OTHER): Payer: 59 | Admitting: Pulmonary Disease

## 2020-12-23 ENCOUNTER — Ambulatory Visit: Payer: 59 | Admitting: Family Medicine

## 2020-12-24 ENCOUNTER — Encounter (HOSPITAL_BASED_OUTPATIENT_CLINIC_OR_DEPARTMENT_OTHER): Payer: 59 | Admitting: Pulmonary Disease

## 2021-01-13 ENCOUNTER — Encounter: Payer: Self-pay | Admitting: Family Medicine

## 2021-01-13 ENCOUNTER — Encounter: Payer: 59 | Admitting: Family Medicine

## 2021-01-14 ENCOUNTER — Ambulatory Visit: Payer: 59 | Admitting: Orthopaedic Surgery

## 2021-02-10 ENCOUNTER — Telehealth: Payer: Self-pay | Admitting: Pulmonary Disease

## 2021-02-10 NOTE — Telephone Encounter (Signed)
I spoke with Veron at the sleep center and he reports that the patient can get a CPAP titration but normally a multiple sleep latency  test is not the way to go per [place]. He wants to know if she can just do the CPAP titration. Please advise.

## 2021-02-11 NOTE — Telephone Encounter (Signed)
Discussed with Marita [place]. Please schedule office visit for this patient in the next week to discuss medications which should be stopped prior to MSLT being scheduled for narcolepsy

## 2021-02-11 NOTE — Telephone Encounter (Signed)
Call made to patient, confirmed DOB. Made aware of RA recommendations. Voiced udnerstanding. She is wanting to know if this can be a virtual visit as she lives in [place].   Ra please advise you have no openings in [place] or [place] next week. Would you like patient to see app? If not what day would you like her double booked or can it be a virtual visit. Thanks

## 2021-02-11 NOTE — Telephone Encounter (Signed)
I left a message for sleep center to call back about the order.

## 2021-02-11 NOTE — Telephone Encounter (Signed)
Noted.   Message has been routed to [place], nothing to do from our end at this time.

## 2021-02-11 NOTE — Telephone Encounter (Signed)
Patient added to our cancellation list in [place].

## 2021-02-12 ENCOUNTER — Telehealth: Payer: Self-pay | Admitting: Pulmonary Disease

## 2021-02-12 NOTE — Telephone Encounter (Signed)
"  Tried to get pt scheduled for 12/5 with Dr. Vassie Loll since there was a cancellation.  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  Scheduled her appt 2/6 with Dr. Vassie Loll."  Will route to Dr. Vassie Loll as a FYI.

## 2021-02-12 NOTE — Telephone Encounter (Signed)
Called and notified patient. They voiced understanding and will call for any further questions/concerns. Nothing further needed.

## 2021-02-15 ENCOUNTER — Encounter (HOSPITAL_BASED_OUTPATIENT_CLINIC_OR_DEPARTMENT_OTHER): Payer: 59 | Admitting: Pulmonary Disease

## 2021-02-16 ENCOUNTER — Encounter (HOSPITAL_BASED_OUTPATIENT_CLINIC_OR_DEPARTMENT_OTHER): Payer: 59 | Admitting: Pulmonary Disease

## 2021-03-24 ENCOUNTER — Telehealth: Payer: Self-pay

## 2021-03-24 NOTE — Telephone Encounter (Signed)
Pt called stating that she signed a record release form for [place] to send her records and xray image of her back to [facility]. Pt says they received her records but not the image of her back.  Please fax images to [phone]

## 2021-03-27 NOTE — Telephone Encounter (Signed)
Faxed over printed images and mailed disc - pt aware

## 2021-04-19 ENCOUNTER — Encounter (HOSPITAL_BASED_OUTPATIENT_CLINIC_OR_DEPARTMENT_OTHER): Payer: 59 | Admitting: Pulmonary Disease

## 2021-04-20 ENCOUNTER — Ambulatory Visit: Payer: 59 | Admitting: Pulmonary Disease

## 2021-04-20 ENCOUNTER — Encounter (HOSPITAL_BASED_OUTPATIENT_CLINIC_OR_DEPARTMENT_OTHER): Payer: 59 | Admitting: Pulmonary Disease

## 2021-04-21 ENCOUNTER — Telehealth: Payer: Self-pay

## 2021-04-21 ENCOUNTER — Other Ambulatory Visit: Payer: Self-pay

## 2021-04-21 ENCOUNTER — Encounter: Payer: Self-pay | Admitting: Pulmonary Disease

## 2021-04-21 DIAGNOSIS — G4710 Hypersomnia, unspecified: Secondary | ICD-10-CM

## 2021-04-21 NOTE — Telephone Encounter (Signed)
Called and informed patient that CPAP titration would be replaced by split night study and would continue the MSLT. Stated we would place the order and someone would call her with an update.

## 2021-05-07 IMAGING — DX DG LUMBAR SPINE 2-3V
2 series · 2 of 2 positions shown · non-contrast
Comparison: None.

CLINICAL DATA: 18-year-old female with back pain.

EXAM:
LUMBAR SPINE - 2-3 VIEW

[l-spine ap]
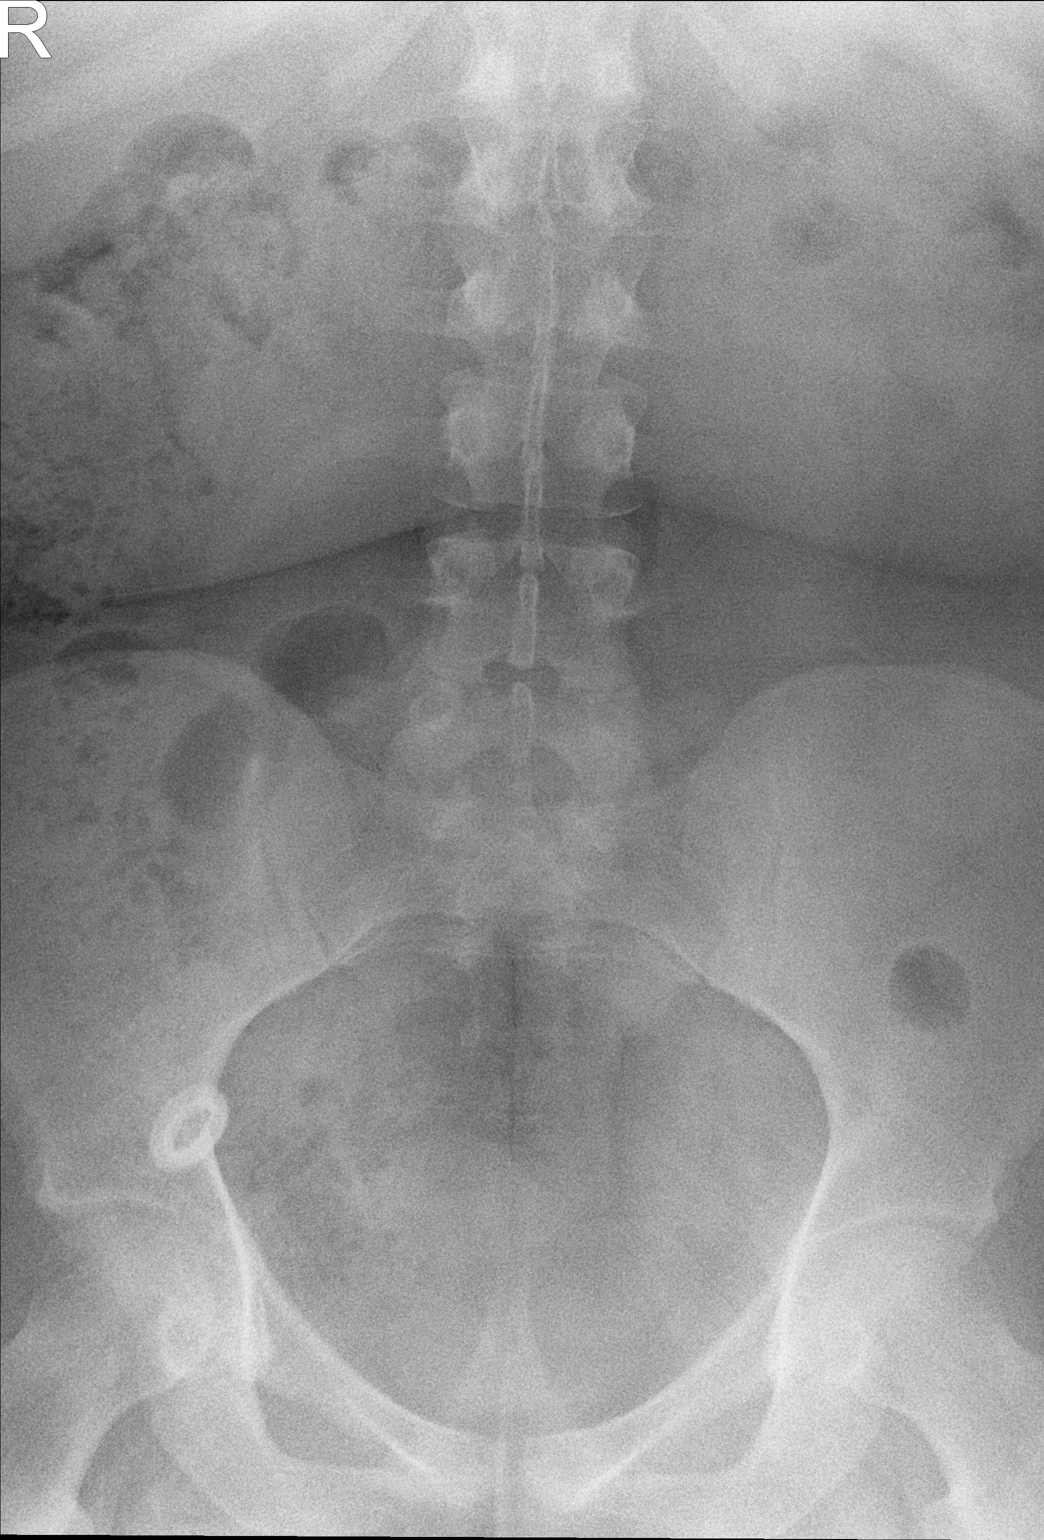

[l-spine lat]
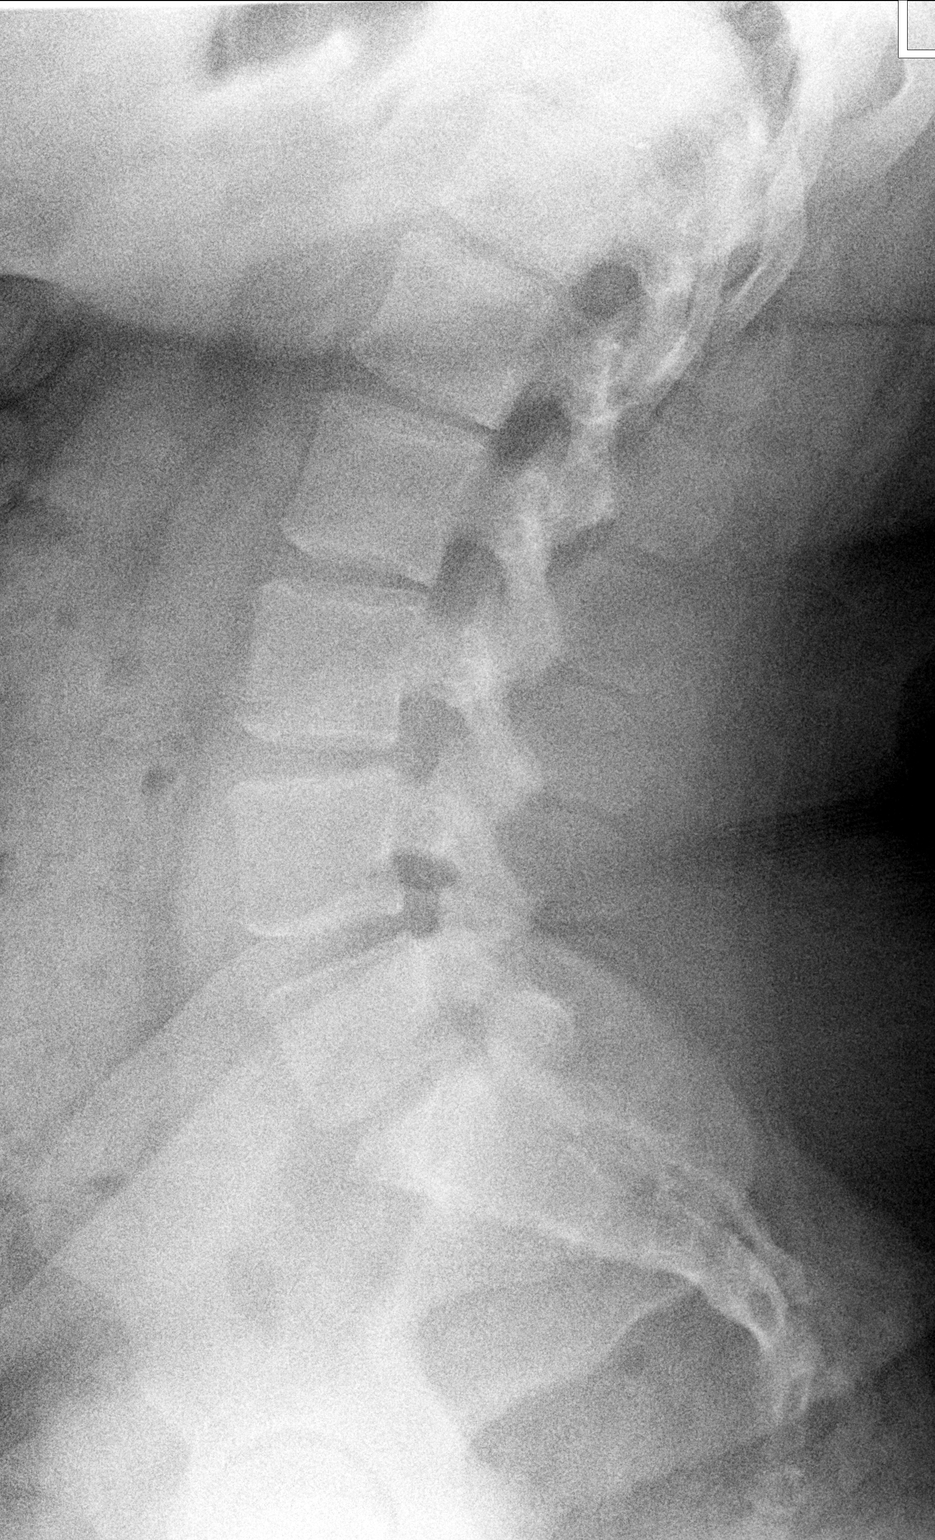

[2 of 2 positions shown; findings below may reference images not displayed]

FINDINGS: Evaluation is limited due to body habitus and soft tissue
attenuation. 6 lumbar type appearing vertebra. There is no acute
fracture or subluxation of the lumbar spine. The vertebral body
heights and disc spaces are maintained. The visualized posterior
elements are intact.
IMPRESSION: No acute/traumatic lumbar spine pathology.

## 2021-05-10 ENCOUNTER — Encounter (HOSPITAL_BASED_OUTPATIENT_CLINIC_OR_DEPARTMENT_OTHER): Payer: 59 | Admitting: Pulmonary Disease

## 2021-06-02 ENCOUNTER — Telehealth: Payer: Self-pay | Admitting: Pulmonary Disease

## 2021-06-02 NOTE — Telephone Encounter (Signed)
She has a sleep study + MSLT scheduled for this coming Sunday. ?Please make sure that she has stopped taking Adderall and clonazepam, prefer for her to be off these medications for at least 2 weeks before doing the study ?

## 2021-06-03 NOTE — Telephone Encounter (Signed)
Dr Vassie Loll it appears the patient has called  and canceled these test, I called and LVM for patient.  Will try again later.  ?

## 2021-06-07 ENCOUNTER — Encounter (HOSPITAL_BASED_OUTPATIENT_CLINIC_OR_DEPARTMENT_OTHER): Payer: 59 | Admitting: Pulmonary Disease

## 2021-06-08 ENCOUNTER — Encounter (HOSPITAL_BASED_OUTPATIENT_CLINIC_OR_DEPARTMENT_OTHER): Payer: 59 | Admitting: Pulmonary Disease

## 2021-07-18 IMAGING — DX DG FOOT COMPLETE 3+V*L*
3 series · 3 of 3 positions shown · non-contrast
Comparison: None.

CLINICAL DATA: Left foot pain.

EXAM:
LEFT FOOT - COMPLETE 3+ VIEW

[foot ap]
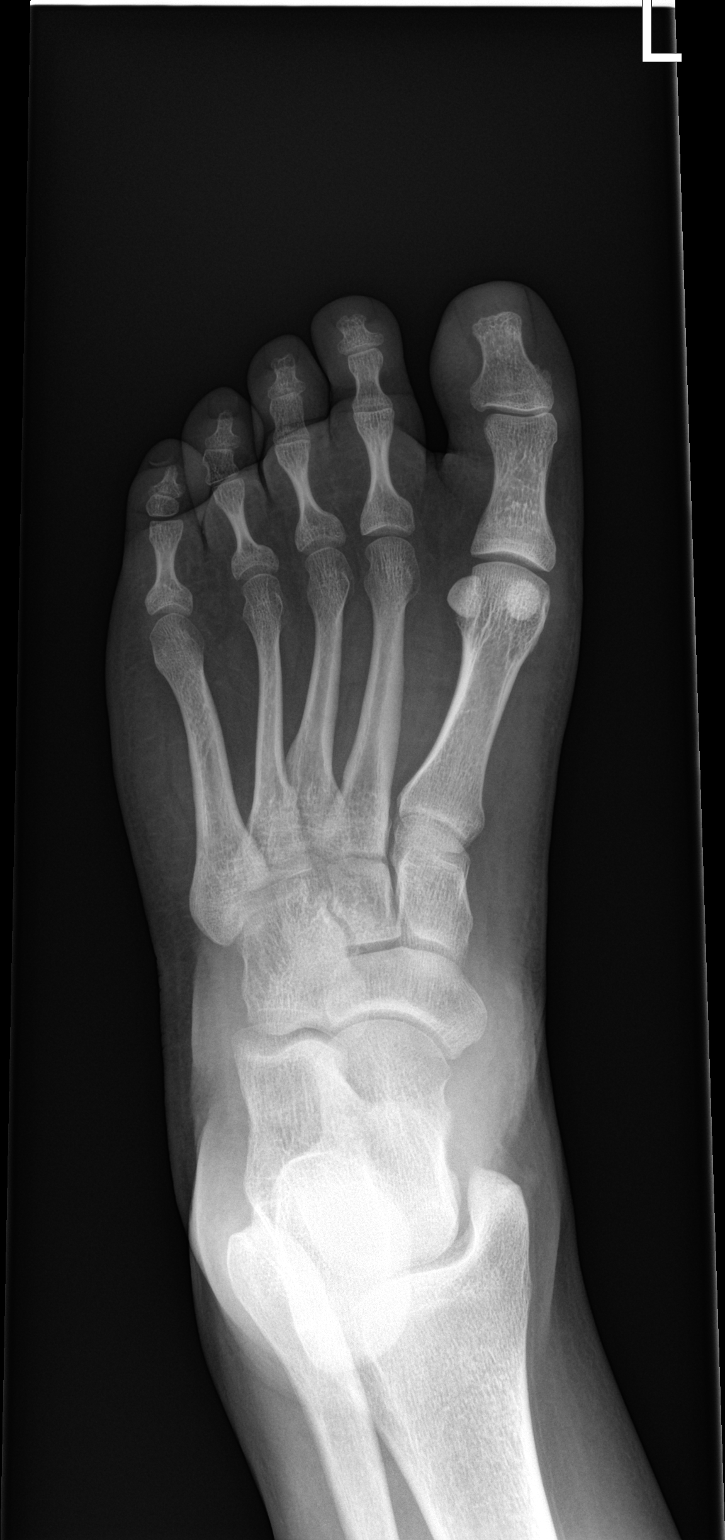

[foot obl]
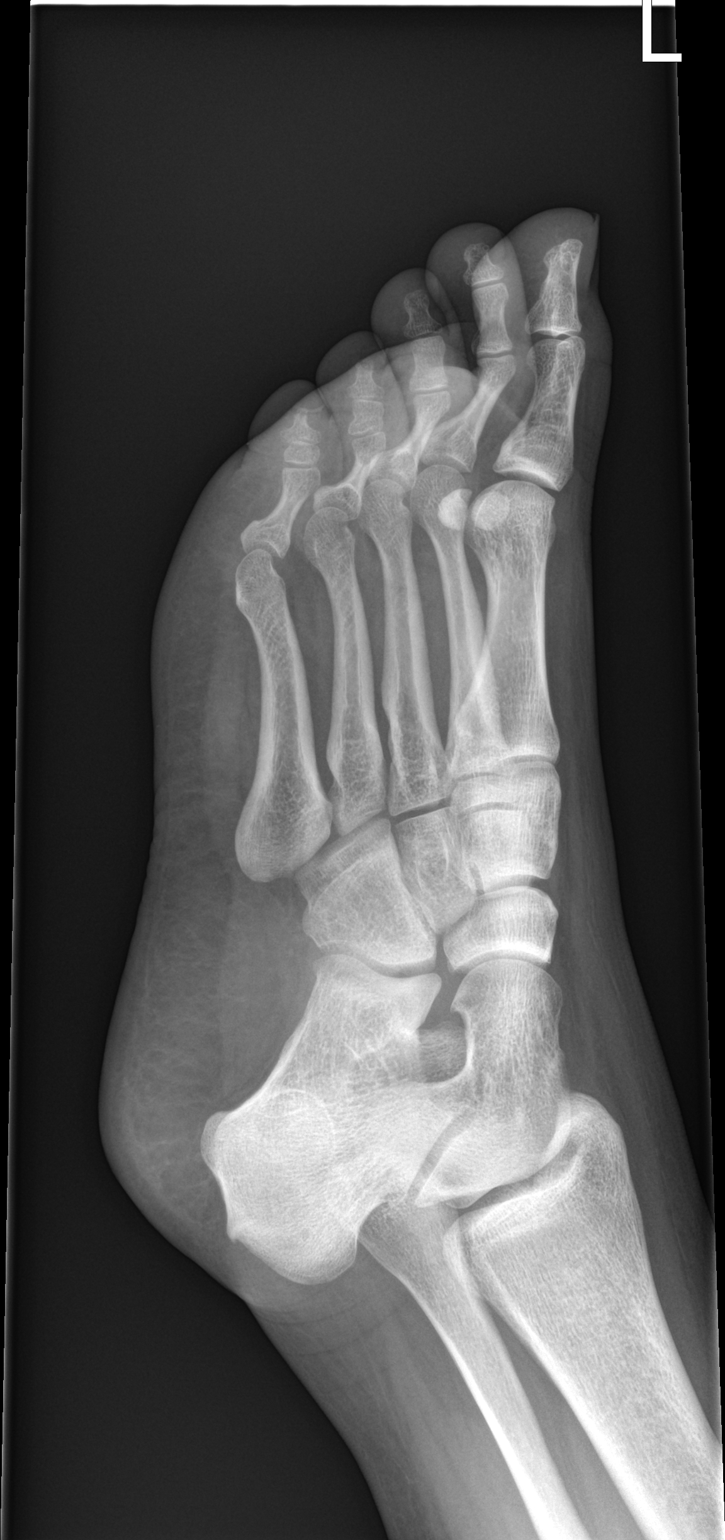

[foot lat]
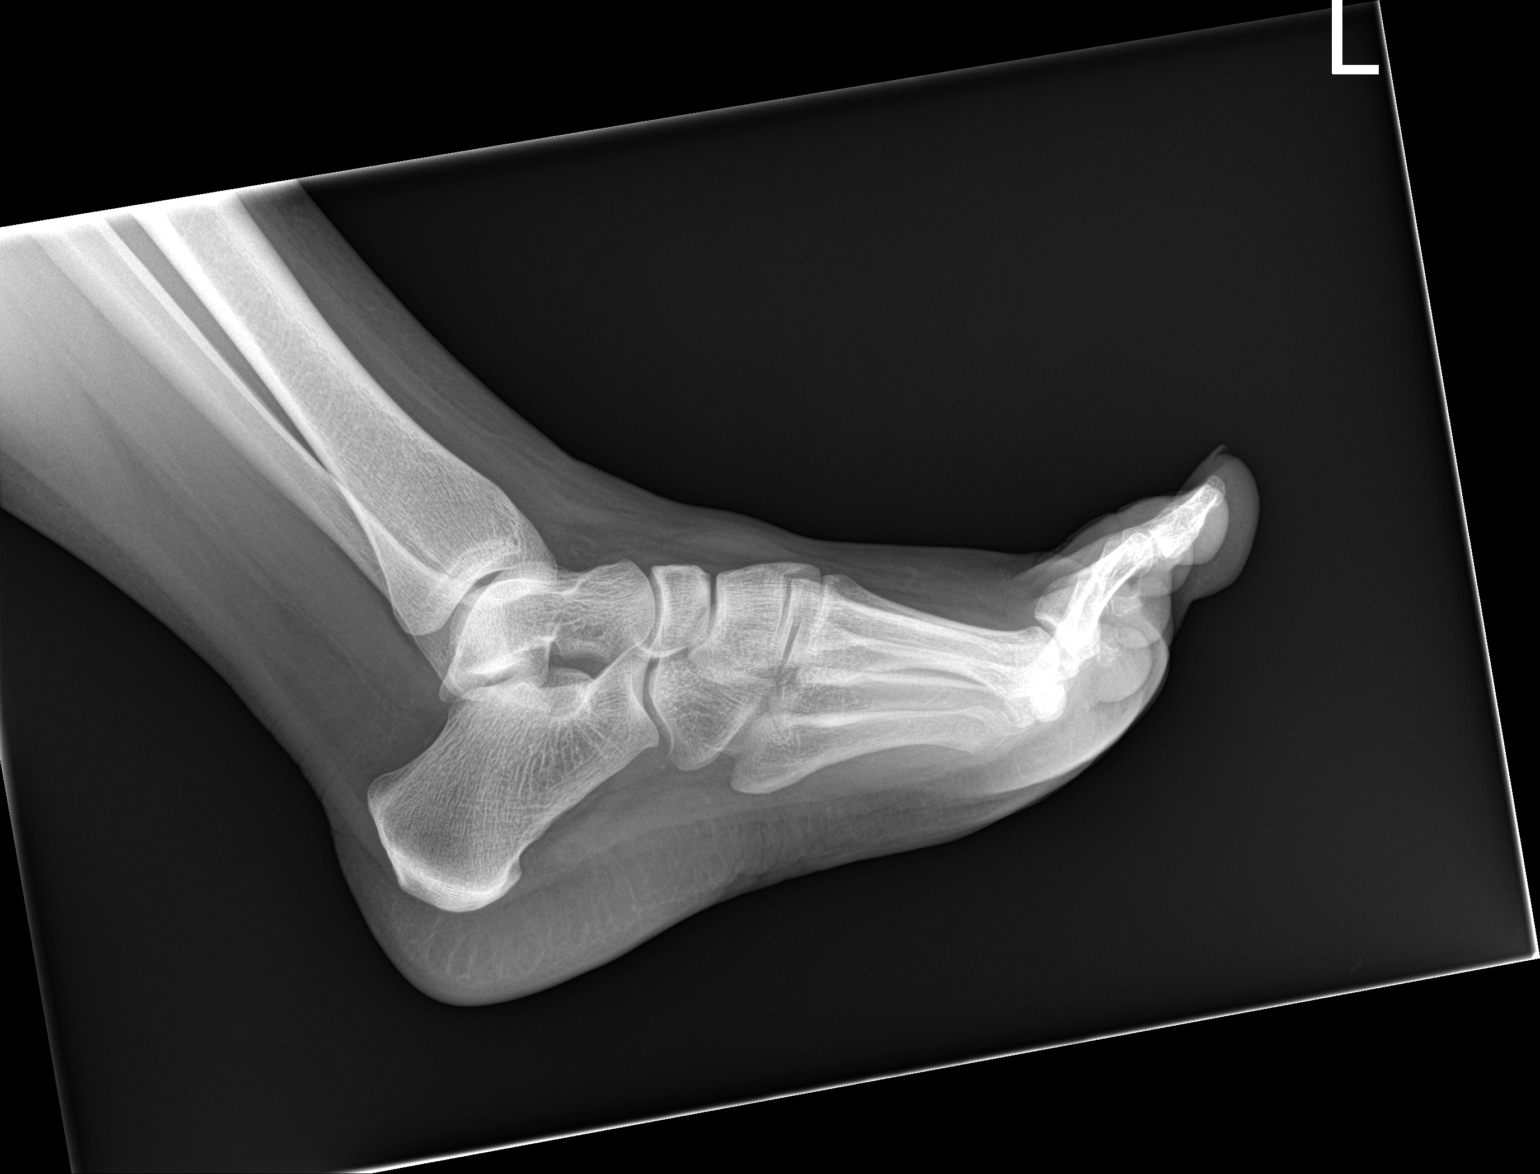

[3 of 3 positions shown; findings below may reference images not displayed]

FINDINGS: There is no evidence of fracture or dislocation. There is no
evidence of arthropathy or other focal bone abnormality. Soft
tissues are unremarkable.
IMPRESSION: Negative.
